# Patient Record
Sex: Male | Born: 1970 | Race: White | Hispanic: No | Marital: Married | Smoking: Never smoker
Health system: Southern US, Community
[De-identification: ages and names within clinical notes are randomized; demographics above are authoritative.]

## PROBLEM LIST (undated history)

## (undated) DIAGNOSIS — F329 Major depressive disorder, single episode, unspecified: Secondary | ICD-10-CM

## (undated) DIAGNOSIS — E1142 Type 2 diabetes mellitus with diabetic polyneuropathy: Secondary | ICD-10-CM

## (undated) DIAGNOSIS — IMO0001 Reserved for inherently not codable concepts without codable children: Secondary | ICD-10-CM

## (undated) DIAGNOSIS — E119 Type 2 diabetes mellitus without complications: Secondary | ICD-10-CM

## (undated) DIAGNOSIS — M542 Cervicalgia: Secondary | ICD-10-CM

## (undated) DIAGNOSIS — R1319 Other dysphagia: Secondary | ICD-10-CM

## (undated) DIAGNOSIS — E669 Obesity, unspecified: Secondary | ICD-10-CM

## (undated) DIAGNOSIS — K200 Eosinophilic esophagitis: Secondary | ICD-10-CM

## (undated) DIAGNOSIS — M7502 Adhesive capsulitis of left shoulder: Secondary | ICD-10-CM

## (undated) DIAGNOSIS — Z86010 Personal history of colonic polyps: Secondary | ICD-10-CM

## (undated) DIAGNOSIS — F32A Depression, unspecified: Secondary | ICD-10-CM

## (undated) DIAGNOSIS — E113599 Type 2 diabetes mellitus with proliferative diabetic retinopathy without macular edema, unspecified eye: Secondary | ICD-10-CM

## (undated) DIAGNOSIS — R4589 Other symptoms and signs involving emotional state: Secondary | ICD-10-CM

## (undated) DIAGNOSIS — Z800 Family history of malignant neoplasm of digestive organs: Secondary | ICD-10-CM

## (undated) DIAGNOSIS — Z794 Long term (current) use of insulin: Secondary | ICD-10-CM

## (undated) DIAGNOSIS — J309 Allergic rhinitis, unspecified: Secondary | ICD-10-CM

## (undated) DIAGNOSIS — E7800 Pure hypercholesterolemia, unspecified: Secondary | ICD-10-CM

## (undated) DIAGNOSIS — Z860101 Personal history of adenomatous and serrated colon polyps: Secondary | ICD-10-CM

## (undated) DIAGNOSIS — K219 Gastro-esophageal reflux disease without esophagitis: Secondary | ICD-10-CM

## (undated) DIAGNOSIS — F419 Anxiety disorder, unspecified: Secondary | ICD-10-CM

## (undated) DIAGNOSIS — I10 Essential (primary) hypertension: Secondary | ICD-10-CM

## (undated) DIAGNOSIS — J329 Chronic sinusitis, unspecified: Secondary | ICD-10-CM

## (undated) HISTORY — DX: Personal history of colonic polyps: Z86.010

## (undated) HISTORY — DX: Pure hypercholesterolemia, unspecified: E78.00

## (undated) HISTORY — DX: Eosinophilic esophagitis: K20.0

## (undated) HISTORY — DX: Personal history of adenomatous and serrated colon polyps: Z86.0101

## (undated) HISTORY — DX: Type 2 diabetes mellitus with proliferative diabetic retinopathy without macular edema, unspecified eye: E11.3599

## (undated) HISTORY — DX: Family history of malignant neoplasm of digestive organs: Z80.0

## (undated) HISTORY — DX: Allergic rhinitis, unspecified: J30.9

## (undated) HISTORY — DX: Other symptoms and signs involving emotional state: R45.89

## (undated) HISTORY — DX: Essential (primary) hypertension: I10

## (undated) HISTORY — DX: Adhesive capsulitis of left shoulder: M75.02

## (undated) HISTORY — DX: Other dysphagia: R13.19

## (undated) HISTORY — DX: Chronic sinusitis, unspecified: J32.9

## (undated) HISTORY — DX: Type 2 diabetes mellitus with diabetic polyneuropathy: E11.42

## (undated) HISTORY — DX: Obesity, unspecified: E66.9

## (undated) HISTORY — DX: Long term (current) use of insulin: Z79.4

## (undated) HISTORY — DX: Cervicalgia: M54.2

## (undated) HISTORY — PX: NO PAST SURGERIES: SHX2092

---

## 2015-01-21 DIAGNOSIS — N529 Male erectile dysfunction, unspecified: Secondary | ICD-10-CM

## 2015-01-21 HISTORY — DX: Male erectile dysfunction, unspecified: N52.9

## 2015-04-06 ENCOUNTER — Ambulatory Visit
Admission: RE | Admit: 2015-04-06 | Discharge: 2015-04-06 | Disposition: A | Discharge status: Home / Self Care | Payer: Federal, State, Local not specified - PPO | Source: Ambulatory Visit | Attending: Internal Medicine | Admitting: Internal Medicine

## 2015-04-06 ENCOUNTER — Other Ambulatory Visit: Payer: Self-pay | Admitting: Internal Medicine

## 2015-04-06 DIAGNOSIS — M25512 Pain in left shoulder: Secondary | ICD-10-CM

## 2015-06-05 ENCOUNTER — Ambulatory Visit: Payer: Federal, State, Local not specified - PPO | Admitting: *Deleted

## 2015-11-04 ENCOUNTER — Other Ambulatory Visit: Payer: Self-pay | Admitting: Gastroenterology

## 2015-11-23 ENCOUNTER — Encounter (HOSPITAL_COMMUNITY): Payer: Self-pay | Admitting: *Deleted

## 2015-12-07 ENCOUNTER — Ambulatory Visit (HOSPITAL_COMMUNITY): Payer: Federal, State, Local not specified - PPO | Admitting: Certified Registered"

## 2015-12-07 ENCOUNTER — Ambulatory Visit (HOSPITAL_COMMUNITY)
Admission: RE | Admit: 2015-12-07 | Discharge: 2015-12-07 | Disposition: A | Discharge status: Home / Self Care | Payer: Federal, State, Local not specified - PPO | Source: Ambulatory Visit | Attending: Gastroenterology | Admitting: Gastroenterology

## 2015-12-07 ENCOUNTER — Encounter (HOSPITAL_COMMUNITY): Payer: Self-pay

## 2015-12-07 ENCOUNTER — Encounter (HOSPITAL_COMMUNITY)
Admission: RE | Disposition: A | Discharge status: Home / Self Care | Payer: Self-pay | Source: Ambulatory Visit | Attending: Gastroenterology

## 2015-12-07 DIAGNOSIS — E1140 Type 2 diabetes mellitus with diabetic neuropathy, unspecified: Secondary | ICD-10-CM | POA: Diagnosis not present

## 2015-12-07 DIAGNOSIS — Z8719 Personal history of other diseases of the digestive system: Secondary | ICD-10-CM | POA: Diagnosis not present

## 2015-12-07 DIAGNOSIS — D123 Benign neoplasm of transverse colon: Secondary | ICD-10-CM | POA: Diagnosis not present

## 2015-12-07 DIAGNOSIS — Z800 Family history of malignant neoplasm of digestive organs: Secondary | ICD-10-CM | POA: Diagnosis not present

## 2015-12-07 DIAGNOSIS — E119 Type 2 diabetes mellitus without complications: Secondary | ICD-10-CM | POA: Diagnosis not present

## 2015-12-07 DIAGNOSIS — Z1211 Encounter for screening for malignant neoplasm of colon: Secondary | ICD-10-CM | POA: Insufficient documentation

## 2015-12-07 DIAGNOSIS — R1314 Dysphagia, pharyngoesophageal phase: Secondary | ICD-10-CM | POA: Diagnosis not present

## 2015-12-07 DIAGNOSIS — Z794 Long term (current) use of insulin: Secondary | ICD-10-CM | POA: Insufficient documentation

## 2015-12-07 DIAGNOSIS — R1310 Dysphagia, unspecified: Secondary | ICD-10-CM | POA: Insufficient documentation

## 2015-12-07 DIAGNOSIS — K200 Eosinophilic esophagitis: Secondary | ICD-10-CM | POA: Diagnosis not present

## 2015-12-07 HISTORY — DX: Reserved for inherently not codable concepts without codable children: IMO0001

## 2015-12-07 HISTORY — DX: Depression, unspecified: F32.A

## 2015-12-07 HISTORY — DX: Type 2 diabetes mellitus without complications: E11.9

## 2015-12-07 HISTORY — PX: ESOPHAGOGASTRODUODENOSCOPY (EGD) WITH PROPOFOL: SHX5813

## 2015-12-07 HISTORY — DX: Anxiety disorder, unspecified: F41.9

## 2015-12-07 HISTORY — PX: COLONOSCOPY WITH PROPOFOL: SHX5780

## 2015-12-07 HISTORY — DX: Major depressive disorder, single episode, unspecified: F32.9

## 2015-12-07 HISTORY — DX: Gastro-esophageal reflux disease without esophagitis: K21.9

## 2015-12-07 LAB — GLUCOSE, CAPILLARY: Glucose-Capillary: 252 mg/dL — ABNORMAL HIGH (ref 65–99)

## 2015-12-07 SURGERY — COLONOSCOPY WITH PROPOFOL
Anesthesia: Monitor Anesthesia Care

## 2015-12-07 MED ORDER — PROPOFOL 10 MG/ML IV BOLUS
INTRAVENOUS | Status: AC
  Filled 2015-12-07: qty 60

## 2015-12-07 MED ORDER — SODIUM CHLORIDE 0.9 % IV SOLN: INTRAVENOUS | Status: DC

## 2015-12-07 MED ORDER — LIDOCAINE HCL (CARDIAC) 20 MG/ML IV SOLN
INTRAVENOUS | Status: DC | PRN
  Administered 2015-12-07: 40 mg via INTRAVENOUS

## 2015-12-07 MED ORDER — MIDAZOLAM HCL 2 MG/2ML IJ SOLN
INTRAMUSCULAR | Status: AC
  Filled 2015-12-07: qty 2

## 2015-12-07 MED ORDER — PROPOFOL 500 MG/50ML IV EMUL
INTRAVENOUS | Status: DC | PRN
  Administered 2015-12-07: 300 ug/kg/min via INTRAVENOUS

## 2015-12-07 MED ORDER — PROPOFOL 10 MG/ML IV BOLUS
INTRAVENOUS | Status: AC
  Filled 2015-12-07: qty 20

## 2015-12-07 MED ORDER — PROPOFOL 10 MG/ML IV BOLUS
INTRAVENOUS | Status: DC | PRN
  Administered 2015-12-07: 120 mg via INTRAVENOUS
  Administered 2015-12-07: 20 mg via INTRAVENOUS

## 2015-12-07 MED ORDER — LACTATED RINGERS IV SOLN
INTRAVENOUS | Status: DC
  Administered 2015-12-07: 07:00:00 via INTRAVENOUS

## 2015-12-07 MED ORDER — LIDOCAINE HCL (CARDIAC) 20 MG/ML IV SOLN
INTRAVENOUS | Status: AC
  Filled 2015-12-07: qty 5

## 2015-12-07 MED ORDER — ONDANSETRON HCL 4 MG/2ML IJ SOLN
INTRAMUSCULAR | Status: AC
  Filled 2015-12-07: qty 2

## 2015-12-07 MED ORDER — ONDANSETRON HCL 4 MG/2ML IJ SOLN
INTRAMUSCULAR | Status: DC | PRN
  Administered 2015-12-07: 4 mg via INTRAVENOUS

## 2015-12-07 SURGICAL SUPPLY — 24 items

## 2015-12-07 NOTE — Discharge Instructions (Signed)
Colonoscopy, Care After °These instructions give you information on caring for yourself after your procedure. Your doctor may also give you more specific instructions. Call your doctor if you have any problems or questions after your procedure. °HOME CARE °· Do not drive for 24 hours. °· Do not sign important papers or use machinery for 24 hours. °· You may shower. °· You may go back to your usual activities, but go slower for the first 24 hours. °· Take rest breaks often during the first 24 hours. °· Walk around or use warm packs on your belly (abdomen) if you have belly cramping or gas. °· Drink enough fluids to keep your pee (urine) clear or pale yellow. °· Resume your normal diet. Avoid heavy or fried foods. °· Avoid drinking alcohol for 24 hours or as told by your doctor. °· Only take medicines as told by your doctor. °If a tissue sample (biopsy) was taken during the procedure:  °· Do not take aspirin or blood thinners for 7 days, or as told by your doctor. °· Do not drink alcohol for 7 days, or as told by your doctor. °· Eat soft foods for the first 24 hours. °GET HELP IF: °You still have a small amount of blood in your poop (stool) 2-3 days after the procedure. °GET HELP RIGHT AWAY IF: °· You have more than a small amount of blood in your poop. °· You see clumps of tissue (blood clots) in your poop. °· Your belly is puffy (swollen). °· You feel sick to your stomach (nauseous) or throw up (vomit). °· You have a fever. °· You have belly pain that gets worse and medicine does not help. °MAKE SURE YOU: °· Understand these instructions. °· Will watch your condition. °· Will get help right away if you are not doing well or get worse. °  °This information is not intended to replace advice given to you by your health care provider. Make sure you discuss any questions you have with your health care provider. °  °Document Released: 09/10/2010 Document Revised: 08/13/2013 Document Reviewed: 04/15/2013 °Elsevier  Interactive Patient Education ©2016 [employer] °Esophagogastroduodenoscopy, Care After °Refer to this sheet in the next few weeks. These instructions provide you with information about caring for yourself after your procedure. Your health care provider may also give you more specific instructions. Your treatment has been planned according to current medical practices, but problems sometimes occur. Call your health care provider if you have any problems or questions after your procedure. °WHAT TO EXPECT AFTER THE PROCEDURE °After your procedure, it is typical to feel: °· Soreness in your throat. °· Pain with swallowing. °· Sick to your stomach (nauseous). °· Bloated. °· Dizzy. °· Fatigued. °HOME CARE INSTRUCTIONS °· Do not eat or drink anything until the numbing medicine (local anesthetic) has worn off and your gag reflex has returned. You will know that the local anesthetic has worn off when you can swallow comfortably. °· Do not drive or operate machinery until directed by your health care provider. °· Take medicines only as directed by your health care provider. °SEEK MEDICAL CARE IF:  °· You cannot stop coughing. °· You are not urinating at all or less than usual. °SEEK IMMEDIATE MEDICAL CARE IF: °· You have difficulty swallowing. °· You cannot eat or drink. °· You have worsening throat or chest pain. °· You have dizziness or lightheadedness or you faint. °· You have nausea or vomiting. °· You have chills. °· You have a fever. °·   You have severe abdominal pain. °· You have black, tarry, or bloody stools. °  °This information is not intended to replace advice given to you by your health care provider. Make sure you discuss any questions you have with your health care provider. °  °Document Released: 07/25/2012 Document Revised: 08/29/2014 Document Reviewed: 07/25/2012 °Elsevier Interactive Patient Education ©2016 [employer] ° °

## 2015-12-07 NOTE — Anesthesia Postprocedure Evaluation (Signed)
Anesthesia Post Note  Patient: Joshua Mendez  Procedure(s) Performed: Procedure(s) (LRB): COLONOSCOPY WITH PROPOFOL (N/A) ESOPHAGOGASTRODUODENOSCOPY (EGD) WITH PROPOFOL (N/A)  Patient location during evaluation: PACU Anesthesia Type: MAC Level of consciousness: awake and alert Pain management: pain level controlled Vital Signs Assessment: post-procedure vital signs reviewed and stable Respiratory status: spontaneous breathing, nonlabored ventilation, respiratory function stable and patient connected to nasal cannula oxygen Cardiovascular status: blood pressure returned to baseline and stable Postop Assessment: no signs of nausea or vomiting Anesthetic complications: no    Last Vitals:  Filed Vitals:   12/07/15 0940 12/07/15 0950  BP: 130/85 123/82  Pulse: 81 76  Temp:    Resp: 16 15    Last Pain: There were no vitals filed for this visit.               Malory Spurr L

## 2015-12-07 NOTE — Anesthesia Preprocedure Evaluation (Addendum)
Anesthesia Evaluation  Patient identified by MRN, date of birth, ID band Patient awake    Reviewed: Allergy & Precautions, H&P , NPO status , Patient's Chart, lab work & pertinent test results  Airway Mallampati: II  TM Distance: >3 FB Neck ROM: full    Dental no notable dental hx. (+) Teeth Intact, Dental Advisory Given   Pulmonary neg pulmonary ROS, shortness of breath and with exertion,    Pulmonary exam normal breath sounds clear to auscultation       Cardiovascular Exercise Tolerance: Good negative cardio ROS Normal cardiovascular exam Rhythm:regular Rate:Normal     Neuro/Psych negative neurological ROS  negative psych ROS   GI/Hepatic negative GI ROS, Neg liver ROS,   Endo/Other  diabetes, Well Controlled, Type 2, Insulin Dependent  Renal/GU negative Renal ROS  negative genitourinary   Musculoskeletal   Abdominal   Peds  Hematology negative hematology ROS (+)   Anesthesia Other Findings   Reproductive/Obstetrics negative OB ROS                           Anesthesia Physical Anesthesia Plan  ASA: III  Anesthesia Plan: MAC   Post-op Pain Management:    Induction:   Airway Management Planned:   Additional Equipment:   Intra-op Plan:   Post-operative Plan:   Informed Consent: I have reviewed the patients History and Physical, chart, labs and discussed the procedure including the risks, benefits and alternatives for the proposed anesthesia with the patient or authorized representative who has indicated his/her understanding and acceptance.   Dental Advisory Given  Plan Discussed with: CRNA  Anesthesia Plan Comments:         Anesthesia Quick Evaluation

## 2015-12-07 NOTE — Op Note (Signed)
[facility] Patient Name: Joshua Mendez Procedure Date: 12/07/2015 MRN: [id number] Attending MD: Garlan Fair , MD Date of Birth: 1970/10/09 CSN:  Age: 45 Admit Type: Outpatient Procedure:                Colonoscopy Indications:              High risk colon cancer surveillance: 03/12/2002                            colonoscopy performed with removal of a 5 mm rectal                            adenomatous polyp. Providers:                Garlan Fair, MD, Laverta Baltimore, RN, Despina Pole, Technician, Ralene Bathe, Technician Referring MD:              Medicines:                Propofol per Anesthesia Complications:            No immediate complications. Estimated Blood Loss:     Estimated blood loss: none. Procedure:                Pre-Anesthesia Assessment:                           - Prior to the procedure, a History and Physical                            was performed, and patient medications and                            allergies were reviewed. The patient's tolerance of                            previous anesthesia was also reviewed. The risks                            and benefits of the procedure and the sedation                            options and risks were discussed with the patient.                            All questions were answered, and informed consent                            was obtained. Prior Anticoagulants: The patient has                            taken no previous anticoagulant or antiplatelet  agents. ASA Grade Assessment: II - A patient with                            mild systemic disease. After reviewing the risks                            and benefits, the patient was deemed in                            satisfactory condition to undergo the procedure.                           After obtaining informed consent, the colonoscope                            was passed  under direct vision. Throughout the                            procedure, the patient's blood pressure, pulse, and                            oxygen saturations were monitored continuously. The                            was introduced through the anus and advanced to the                            the cecum, identified by appendiceal orifice and                            ileocecal valve. The colonoscopy was performed                            without difficulty. The patient tolerated the                            procedure well. The quality of the bowel                            preparation was good. The ileocecal valve, the                            appendiceal orifice and the rectum were                            photographed. Scope In: 8:47:23 AM Scope Out: 9:04:56 AM Scope Withdrawal Time: 0 hours 12 minutes 0 seconds  Total Procedure Duration: 0 hours 17 minutes 33 seconds  Findings:      The perianal and digital rectal examinations were normal.      A 5 mm polyp was found in the mid transverse colon. The polyp was       sessile. The polyp was removed with a cold snare. Resection and       retrieval were complete.  The exam was otherwise without abnormality. Impression:               - One 5 mm polyp in the mid transverse colon,                            removed with a cold snare. Resected and retrieved.                           - The examination was otherwise normal. Moderate Sedation:      N/A- Per Anesthesia Care Recommendation:           - Patient has a contact number available for                            emergencies. The signs and symptoms of potential                            delayed complications were discussed with the                            patient. Return to normal activities tomorrow.                            Written discharge instructions were provided to the                            patient.                           - Repeat colonoscopy in  5 years for surveillance.                           - Resume previous diet.                           - Continue present medications. Procedure Code(s):        --- Professional ---                           [phone], Colonoscopy, flexible; with removal of                            tumor(s), polyp(s), or other lesion(s) by snare                            technique Diagnosis Code(s):        --- Professional ---                           Z86.010, Personal history of colonic polyps                           D12.3, Benign neoplasm of transverse colon (hepatic                            flexure or splenic flexure) CPT  copyright 2016 American Medical Association. All rights reserved. The codes documented in this report are preliminary and upon coder review may  be revised to meet current compliance requirements. Earle Gell, MD Garlan Fair, MD 12/07/2015 9:12:06 AM This report has been signed electronically. Number of Addenda: 0

## 2015-12-07 NOTE — Op Note (Signed)
[facility] Patient Name: Joshua Mendez Procedure Date: 12/07/2015 MRN: [id number] Attending MD: Garlan Fair , MD Date of Birth: 11-12-1970 CSN:  Age: 45 Admit Type: Outpatient Procedure:                Upper GI endoscopy Indications:              Dysphagia. Eosinophilic esophagitis diagnosed by                            EGD with biopsies in 2012 Providers:                Garlan Fair, MD, Laverta Baltimore, RN, Despina Pole, Technician, Ralene Bathe, Technician, Dione Booze, CRNA Referring MD:              Medicines:                Propofol per Anesthesia Complications:            No immediate complications. Estimated Blood Loss:     Estimated blood loss: none. Procedure:                Pre-Anesthesia Assessment:                           - Prior to the procedure, a History and Physical                            was performed, and patient medications and                            allergies were reviewed. The patient's tolerance of                            previous anesthesia was also reviewed. The risks                            and benefits of the procedure and the sedation                            options and risks were discussed with the patient.                            All questions were answered, and informed consent                            was obtained. Prior Anticoagulants: The patient has                            taken no previous anticoagulant or antiplatelet                            agents.  ASA Grade Assessment: II - A patient with                            mild systemic disease. After reviewing the risks                            and benefits, the patient was deemed in                            satisfactory condition to undergo the procedure.                           - Prior to the procedure, a History and Physical                            was performed, and patient  medications and                            allergies were reviewed. The patient's tolerance of                            previous anesthesia was also reviewed. The risks                            and benefits of the procedure and the sedation                            options and risks were discussed with the patient.                            All questions were answered, and informed consent                            was obtained. Prior Anticoagulants: The patient has                            taken no previous anticoagulant or antiplatelet                            agents. ASA Grade Assessment: II - A patient with                            mild systemic disease. After reviewing the risks                            and benefits, the patient was deemed in                            satisfactory condition to undergo the procedure.                           After obtaining informed consent, the endoscope was  passed under direct vision. Throughout the                            procedure, the patient's blood pressure, pulse, and                            oxygen saturations were monitored continuously. The                            [id number] [phone]) scope was introduced through the                            mouth, and advanced to the second part of duodenum.                            The upper GI endoscopy was accomplished without                            difficulty. The patient tolerated the procedure                            well. Scope In: Scope Out: Findings:      The Z-line was regular and was found 44 cm from the incisors.      The examined esophagus was normal. Biopsies were taken with a cold       forceps for histology.      The entire examined stomach was normal.      The examined duodenum was normal. Impression:               - Z-line regular, 44 cm from the incisors.                           - Normal esophagus. Biopsied.                            - Normal stomach.                           - Normal examined duodenum. Moderate Sedation:      N/A- Per Anesthesia Care Recommendation:           - Patient has a contact number available for                            emergencies. The signs and symptoms of potential                            delayed complications were discussed with the                            patient. Return to normal activities tomorrow.                            Written discharge instructions were provided to the  patient.                           - Await pathology results.                           - Resume previous diet.                           - Continue present medications. Procedure Code(s):        --- Professional ---                           [phone], Esophagogastroduodenoscopy, flexible,                            transoral; with biopsy, single or multiple Diagnosis Code(s):        --- Professional ---                           R13.10, Dysphagia, unspecified CPT copyright 2016 American Medical Association. All rights reserved. The codes documented in this report are preliminary and upon coder review may  be revised to meet current compliance requirements. Earle Gell, MD Garlan Fair, MD 12/07/2015 9:16:08 AM This report has been signed electronically. Number of Addenda: 0

## 2015-12-07 NOTE — H&P (Signed)
  Procedure: Surveillance colonoscopy and diagnostic esophagogastroduodenoscopy with esophageal stricture dilation. History of eosinophilic esophagitis. 03/12/2002 colonoscopy was performed with removal of a 5 mm rectal adenomatous colon polyp. Normal surveillance colonoscopies were performed in 2004, 2008, 2012. Father diagnosed with colon cancer.  History: The patient is a 45 year old male born [birth year]. He is experiencing intermittent esophageal dysphagia. On 12/03/2010 he underwent an esophagogastroduodenoscopy with dilation of a Schatzki's ring. Random esophageal biopsies were consistent with eosinophilic esophagitis with 60-100 eosinophils per high-powered field. He has been lax taking his proton pump inhibitor therapy.  He is scheduled to undergo diagnostic esophagogastroduodenoscopy with possible esophageal stricture dilation and screen for eosinophilic esophagitis followed by surveillance colonoscopy.  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 Hypertension. Gastroesophageal reflux.  Medication allergies: Penicillin caused rash. Neomycin caused rash.  Exam: The patient is alert and lying comfortably on the endoscopy stretcher. Abdomen is soft and nontender to palpation. Lungs are clear to auscultation. Cardiac exam reveals a regular rhythm.  Plan: Proceed with diagnostic esophagogastroduodenoscopy, possible esophageal stricture dilation, screen for eosinophilic esophagitis, and surveillance colonoscopy

## 2015-12-07 NOTE — Transfer of Care (Signed)
Immediate Anesthesia Transfer of Care Note  Patient: Joshua Mendez  Procedure(s) Performed: Procedure(s): COLONOSCOPY WITH PROPOFOL (N/A) ESOPHAGOGASTRODUODENOSCOPY (EGD) WITH PROPOFOL (N/A)  Patient Location: PACU  Anesthesia Type:MAC  Level of Consciousness:  sedated, patient cooperative and responds to stimulation  Airway & Oxygen Therapy:Patient Spontanous Breathing and Patient connected to nasal oxgen  Post-op Assessment:  Report given to PACU RN and Post -op Vital signs reviewed and stable  Post vital signs:  Reviewed and stable  Last Vitals:  Filed Vitals:   12/07/15 0639  Pulse: 82  Temp: 36.7 C  Resp: 13    Complications: No apparent anesthesia complications

## 2015-12-08 ENCOUNTER — Encounter (HOSPITAL_COMMUNITY): Payer: Self-pay | Admitting: Gastroenterology

## 2016-03-07 DIAGNOSIS — I10 Essential (primary) hypertension: Secondary | ICD-10-CM | POA: Diagnosis not present

## 2016-03-07 DIAGNOSIS — F322 Major depressive disorder, single episode, severe without psychotic features: Secondary | ICD-10-CM | POA: Diagnosis not present

## 2016-03-07 DIAGNOSIS — E1165 Type 2 diabetes mellitus with hyperglycemia: Secondary | ICD-10-CM | POA: Diagnosis not present

## 2016-03-07 DIAGNOSIS — E7800 Pure hypercholesterolemia, unspecified: Secondary | ICD-10-CM | POA: Diagnosis not present

## 2016-03-07 DIAGNOSIS — E1142 Type 2 diabetes mellitus with diabetic polyneuropathy: Secondary | ICD-10-CM | POA: Diagnosis not present

## 2016-06-07 DIAGNOSIS — I10 Essential (primary) hypertension: Secondary | ICD-10-CM | POA: Diagnosis not present

## 2016-06-07 DIAGNOSIS — F322 Major depressive disorder, single episode, severe without psychotic features: Secondary | ICD-10-CM | POA: Diagnosis not present

## 2016-06-07 DIAGNOSIS — E1165 Type 2 diabetes mellitus with hyperglycemia: Secondary | ICD-10-CM | POA: Diagnosis not present

## 2016-06-07 DIAGNOSIS — Z23 Encounter for immunization: Secondary | ICD-10-CM | POA: Diagnosis not present

## 2016-06-07 DIAGNOSIS — E1142 Type 2 diabetes mellitus with diabetic polyneuropathy: Secondary | ICD-10-CM | POA: Diagnosis not present

## 2016-08-24 DIAGNOSIS — E113293 Type 2 diabetes mellitus with mild nonproliferative diabetic retinopathy without macular edema, bilateral: Secondary | ICD-10-CM | POA: Diagnosis not present

## 2016-08-24 DIAGNOSIS — H524 Presbyopia: Secondary | ICD-10-CM | POA: Diagnosis not present

## 2016-09-23 DIAGNOSIS — H01115 Allergic dermatitis of left lower eyelid: Secondary | ICD-10-CM | POA: Diagnosis not present

## 2016-09-23 DIAGNOSIS — H01112 Allergic dermatitis of right lower eyelid: Secondary | ICD-10-CM | POA: Diagnosis not present

## 2016-09-23 DIAGNOSIS — H01111 Allergic dermatitis of right upper eyelid: Secondary | ICD-10-CM | POA: Diagnosis not present

## 2016-10-03 DIAGNOSIS — J309 Allergic rhinitis, unspecified: Secondary | ICD-10-CM | POA: Diagnosis not present

## 2016-10-03 DIAGNOSIS — H1013 Acute atopic conjunctivitis, bilateral: Secondary | ICD-10-CM | POA: Diagnosis not present

## 2016-11-30 DIAGNOSIS — J309 Allergic rhinitis, unspecified: Secondary | ICD-10-CM | POA: Diagnosis not present

## 2016-11-30 DIAGNOSIS — M79672 Pain in left foot: Secondary | ICD-10-CM | POA: Diagnosis not present

## 2016-11-30 DIAGNOSIS — R0789 Other chest pain: Secondary | ICD-10-CM | POA: Diagnosis not present

## 2016-12-01 ENCOUNTER — Ambulatory Visit
Admission: RE | Admit: 2016-12-01 | Discharge: 2016-12-01 | Disposition: A | Discharge status: Home / Self Care | Payer: Federal, State, Local not specified - PPO | Source: Ambulatory Visit | Attending: Internal Medicine | Admitting: Internal Medicine

## 2016-12-01 ENCOUNTER — Other Ambulatory Visit: Payer: Self-pay | Admitting: Internal Medicine

## 2016-12-01 DIAGNOSIS — M79672 Pain in left foot: Secondary | ICD-10-CM | POA: Diagnosis not present

## 2016-12-01 DIAGNOSIS — E1165 Type 2 diabetes mellitus with hyperglycemia: Secondary | ICD-10-CM | POA: Diagnosis not present

## 2016-12-01 DIAGNOSIS — Z5181 Encounter for therapeutic drug level monitoring: Secondary | ICD-10-CM | POA: Diagnosis not present

## 2016-12-01 DIAGNOSIS — Z794 Long term (current) use of insulin: Secondary | ICD-10-CM | POA: Diagnosis not present

## 2016-12-01 DIAGNOSIS — Z79899 Other long term (current) drug therapy: Secondary | ICD-10-CM | POA: Diagnosis not present

## 2016-12-01 DIAGNOSIS — E669 Obesity, unspecified: Secondary | ICD-10-CM | POA: Diagnosis not present

## 2016-12-02 NOTE — Progress Notes (Signed)
Cardiology Office Note   Date:  12/06/2016   ID:  Joshua Mendez, DOB [birth year], MRN [id number]  PCP:  Wenda Low, MD  Cardiologist:   Jenkins Rouge, MD   Chief Complaint  Patient presents with  . Establish Care  . Chest Pain      History of Present Illness: Joshua Mendez is a 46 y.o. male who presents for evaluation/consultation of chest pain.  Referred by Dr Lysle Rubens.   CRF;s DM, elevated lipids and HTN.  History of Schatzkis ring and esophagitis with dilatation Reviewed office Note Dr Wenda Low from [facility] 11/30/16 .   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 Was walking Lasted about an hour. No associated dyspnea, palpitations Diaphoresis or syncope No GI overtones Resolved spontaneously. Pain in center chest with no radiation Last stress Test was 7 years ago   Also complains of dependant edema   Past Medical History:  Diagnosis Date  . Anxiety   . Depression   . Diabetes mellitus without complication ([place])   . GERD (gastroesophageal reflux disease)   . Shortness of breath dyspnea    with exercise    Past Surgical History:  Procedure Laterality Date  . COLONOSCOPY WITH PROPOFOL N/A 12/07/2015   Procedure: COLONOSCOPY WITH PROPOFOL;  Surgeon: Garlan Fair, MD;  Location: [facility];  Service: Endoscopy;  Laterality: N/A;  . ESOPHAGOGASTRODUODENOSCOPY (EGD) WITH PROPOFOL N/A 12/07/2015   Procedure: ESOPHAGOGASTRODUODENOSCOPY (EGD) WITH PROPOFOL;  Surgeon: Garlan Fair, MD;  Location: [facility];  Service: Endoscopy;  Laterality: N/A;  . NO PAST SURGERIES       Current Outpatient Prescriptions  Medication Sig Dispense Refill  . acetaminophen (TYLENOL) 500 MG tablet Take 500 mg by mouth every 6 (six) hours as needed for mild pain or  moderate pain.    Marland Kitchen aspirin EC 81 MG tablet Take 81 mg by mouth daily.    Marland Kitchen atorvastatin (LIPITOR) 40 MG tablet Take 40 mg by mouth daily.    . cetirizine (ZYRTEC) 10 MG tablet Take 10 mg by mouth daily.    . Dapagliflozin-Metformin HCl ER (XIGDUO XR) 12-998 MG TB24 Take 2 tablets by mouth every morning.     . escitalopram (LEXAPRO) 10 MG tablet Take 10 mg by mouth at bedtime.     . fluticasone (FLONASE) 50 MCG/ACT nasal spray Place 2 sprays into both nostrils daily.    Marland Kitchen gabapentin (NEURONTIN) 100 MG capsule Take 100 mg by mouth at bedtime.     Marland Kitchen ibuprofen (ADVIL,MOTRIN) 200 MG tablet Take 400 mg by mouth every 6 (six) hours as needed for moderate pain.    Marland Kitchen insulin aspart (NOVOLOG) 100 UNIT/ML injection Inject 40 Units into the skin continuous.    . Insulin Disposable Pump (V-GO 30) KIT by Does not apply route.    . Insulin Disposable Pump (V-GO 40) KIT Inject 40 Units into the skin daily.    Marland Kitchen linagliptin (TRADJENTA) 5 MG TABS tablet Take 5 mg by mouth daily.    Marland Kitchen lisinopril (PRINIVIL,ZESTRIL) 5 MG tablet Take 5 mg by mouth daily.    . meloxicam (MOBIC) 15 MG tablet Take 15 mg by mouth daily as needed for pain.    . naproxen sodium (ANAPROX) 220 MG tablet  Take 220 mg by mouth daily as needed (pain).    Marland Kitchen olopatadine (PATANOL) 0.1 % ophthalmic solution Place 1 drop into the left eye 2 (two) times daily as needed.    Marland Kitchen omeprazole (PRILOSEC) 20 MG capsule Take 20 mg by mouth daily.     No current facility-administered medications for this visit.     Allergies:   Penicillins and Neosporin [neomycin-bacitracin zn-polymyx]    Social History:  The patient  reports that he has never smoked. He does not have any smokeless tobacco history on file. He reports that he drinks alcohol. He reports that he does not use drugs.   Family History:  The patient's family history is not on file.    ROS:  Please see the history of present illness.   Otherwise, review of systems are positive for none.   All  other systems are reviewed and negative.    PHYSICAL EXAM: VS:  BP 104/68   Pulse 87   Ht 6' 1"  (1.854 m)   Wt 255 lb 12.8 oz (116 kg)   SpO2 (!) 87%   BMI 33.75 kg/m  , BMI Body mass index is 33.75 kg/m. Affect appropriate Healthy:  appears stated age 74: normal Neck supple with no adenopathy JVP normal no bruits no thyromegaly Lungs clear with no wheezing and good diaphragmatic motion Heart:  S1/S2 no murmur, no rub, gallop or click PMI normal Abdomen: benighn, BS positve, no tenderness, no AAA no bruit.  No HSM or HJR Distal pulses intact with no bruits No edema Neuro non-focal Skin warm and dry No muscular weakness    EKG:  12/06/16  SR rate 82 Q 3,F poor R wave progression    Recent Labs: No results found for requested labs within last 8760 hours.    Lipid Panel No results found for: CHOL, TRIG, HDL, CHOLHDL, VLDL, LDLCALC, LDLDIRECT    Wt Readings from Last 3 Encounters:  12/06/16 255 lb 12.8 oz (116 kg)  12/07/15 250 lb (113.4 kg)      Other studies Reviewed: Additional studies/ records that were reviewed today include: Notes [facility] outpatient Dr Lorenda Hatchet.    ASSESSMENT AND PLAN:  1.  Chest Pain Atypical but abnormal ECG suggesting possible silent MI in diabetic f/u exercise myovue And echo Discussed doing stress testing every 3 years 2. DM Discussed low carb diet.  Target hemoglobin A1c is 6.5 or less.  Continue current medications. 3. HTN Well controlled.  Continue current medications and low sodium Dash type diet.   4. Cholesterol on statin labs with primary  5. GERD low carb diet prilosec  6. Abnormal ECG:  Doubt old MI echo for RWMA;s     Current medicines are reviewed at length with the patient today.  The patient does not have concerns regarding medicines.  The following changes have been made:  no change  Labs/ tests ordered today include: Echo Ex Myovue   Orders Placed This Encounter  Procedures  . Myocardial Perfusion Imaging  .  ECHOCARDIOGRAM COMPLETE     Disposition:   FU with me in a year      Signed, Jenkins Rouge, MD  12/06/2016 9:56 AM    [facility] [place], [place], [place]  [address] Phone: [phone]; Fax: [phone]

## 2016-12-06 ENCOUNTER — Ambulatory Visit (INDEPENDENT_AMBULATORY_CARE_PROVIDER_SITE_OTHER): Payer: Federal, State, Local not specified - PPO | Admitting: Cardiovascular Disease

## 2016-12-06 VITALS — BP 104/68 | HR 87 | Ht 73.0 in | Wt 255.8 lb

## 2016-12-06 DIAGNOSIS — Z7689 Persons encountering health services in other specified circumstances: Secondary | ICD-10-CM

## 2016-12-06 DIAGNOSIS — R0789 Other chest pain: Secondary | ICD-10-CM | POA: Diagnosis not present

## 2016-12-06 NOTE — Patient Instructions (Addendum)
Medication Instructions:  Your physician recommends that you continue on your current medications as directed. Please refer to the Current Medication list given to you today.  Labwork: NONE  Testing/Procedures: Your physician has requested that you have an echocardiogram. Echocardiography is a painless test that uses sound waves to create images of your heart. It provides your doctor with information about the size and shape of your heart and how well your heart's chambers and valves are working. This procedure takes approximately one hour. There are no restrictions for this procedure.  Your physician has requested that you have en exercise stress myoview. For further information please visit [email or web address]. Please follow instruction sheet, as given.  Follow-Up: Your physician wants you to follow-up in: 12 months with Dr. Nishan. You will receive a reminder letter in the mail two months in advance. If you don't receive a letter, please call our office to schedule the follow-up appointment.   If you need a refill on your cardiac medications before your next appointment, please call your pharmacy.    

## 2016-12-08 NOTE — Addendum Note (Signed)
Addended by: Roberts Gaudy on: 12/08/2016 09:13 AM   Modules accepted: Orders

## 2016-12-14 ENCOUNTER — Ambulatory Visit (INDEPENDENT_AMBULATORY_CARE_PROVIDER_SITE_OTHER): Payer: Federal, State, Local not specified - PPO | Admitting: Podiatry

## 2016-12-14 ENCOUNTER — Ambulatory Visit (INDEPENDENT_AMBULATORY_CARE_PROVIDER_SITE_OTHER): Payer: Federal, State, Local not specified - PPO

## 2016-12-14 VITALS — BP 117/71 | HR 65

## 2016-12-14 DIAGNOSIS — M722 Plantar fascial fibromatosis: Secondary | ICD-10-CM

## 2016-12-14 MED ORDER — DICLOFENAC SODIUM 75 MG PO TBEC: 75.0000 mg | DELAYED_RELEASE_TABLET | Freq: Two times a day (BID) | ORAL | 2 refills | Status: DC

## 2016-12-14 MED ORDER — TRIAMCINOLONE ACETONIDE 10 MG/ML IJ SUSP
10.0000 mg | Freq: Once | INTRAMUSCULAR | Status: AC
  Administered 2016-12-14: 10 mg

## 2016-12-14 NOTE — Patient Instructions (Signed)

## 2016-12-14 NOTE — Progress Notes (Signed)
   Subjective:    Patient ID: Joshua Mendez, male    DOB: 06-23-1971, 46 y.o.   MRN: [id number]  HPI    Review of Systems  HENT: Positive for sinus pressure.   Respiratory: Positive for shortness of breath.   Cardiovascular: Positive for chest pain and leg swelling.  Musculoskeletal: Positive for back pain and myalgias.  Neurological: Positive for dizziness and light-headedness.  All other systems reviewed and are negative.      Objective:   Physical Exam        Assessment & Plan:

## 2016-12-14 NOTE — Progress Notes (Signed)
Subjective:    Patient ID: Joshua Mendez, male   DOB: 46 y.o.   MRN: [id number]   HPI patient states she's developed a lot of pain in his left heel over the last several months and went to [employer] did a lot of walking and it's been bad since then    Review of Systems  All other systems reviewed and are negative.       Objective:  Physical Exam  Constitutional: He is oriented to person, place, and time.  Cardiovascular: Intact distal pulses.   Musculoskeletal: Normal range of motion.  Neurological: He is alert and oriented to person, place, and time.  Skin: Skin is warm.  Nursing note and vitals reviewed.  neurovascular status found to be intact muscle strength was adequate range of motion within normal limits with patient found to have exquisite discomfort plantar aspect left heel insertional point tendon the calcaneus and mild distal pain. Patient's found have good digital perfusion well oriented 3 and had normal sharp dull and vibratory     Assessment:     Acute plantar fasciitis left brought on by excessive walking with patient who is a diabetic and is overall very good as far as is taking care of his sugar    Plan:    H&P and condition reviewed with patient. Today I went ahead and injected the left fascia 3 mg Kenalog 5 mill grams Xylocaine and applied fascial brace with instructions on usage. Discussed long-term orthotics and gave instructions on stretching exercises and started on diclofenac 75 mg twice a day  X-rays indicate there is small spur formation with moderate depression of the arch and no indication of stress fracture

## 2016-12-19 ENCOUNTER — Telehealth (HOSPITAL_COMMUNITY): Payer: Self-pay | Admitting: *Deleted

## 2016-12-19 NOTE — Telephone Encounter (Signed)
Left message on voicemail in reference to upcoming appointment scheduled for 12/21/16. Phone number given for a call back so details instructions can be given.  [name]

## 2016-12-21 ENCOUNTER — Other Ambulatory Visit: Payer: Self-pay

## 2016-12-21 ENCOUNTER — Ambulatory Visit (HOSPITAL_COMMUNITY): Payer: Federal, State, Local not specified - PPO | Attending: Cardiology

## 2016-12-21 ENCOUNTER — Ambulatory Visit (HOSPITAL_BASED_OUTPATIENT_CLINIC_OR_DEPARTMENT_OTHER): Payer: Federal, State, Local not specified - PPO

## 2016-12-21 DIAGNOSIS — R0789 Other chest pain: Secondary | ICD-10-CM | POA: Diagnosis not present

## 2016-12-21 LAB — ECHOCARDIOGRAM COMPLETE
Ao-asc: 35 cm
Area-P 1/2: 3.44 cm2
CHL CUP DOP CALC LVOT VTI: 26 cm
CHL CUP MV DEC (S): 218
E/e' ratio: 5.89
EWDT: 218 ms
FS: 39 % (ref 28–44)
IVS/LV PW RATIO, ED: 1.11
LA ID, A-P, ES: 45 mm
LA diam index: 1.88 cm/m2
LA vol A4C: 56.6 ml
LA vol: 72.5 mL
LAVOLIN: 30.3 mL/m2
LEFT ATRIUM END SYS DIAM: 45 mm
LV E/e'average: 5.89
LV TDI E'LATERAL: 15.1
LV TDI E'MEDIAL: 11
LVEEMED: 5.89
LVELAT: 15.1 cm/s
LVOT area: 4.15 cm2
LVOT peak grad rest: 6 mmHg
LVOTD: 23 mm
LVOTPV: 126 cm/s
LVOTSV: 108 mL
Lateral S' vel: 17.1 cm/s
MV Peak grad: 3 mmHg
MV pk A vel: 58.7 m/s
MV pk E vel: 89 m/s
P 1/2 time: 64 ms
PW: 9 mm — AB (ref 0.6–1.1)
TAPSE: 22.9 mm

## 2016-12-21 LAB — MYOCARDIAL PERFUSION IMAGING
CHL CUP MPHR: 172 {beats}/min
CHL CUP RESTING HR STRESS: 75 {beats}/min
CSEPED: 10 min
CSEPEDS: 0 s
CSEPEW: 11.8 METS
CSEPPHR: 155 {beats}/min
LHR: 0.38
LV dias vol: 133 mL (ref 62–150)
LVSYSVOL: 64 mL
Percent HR: 90 %
RPE: 19
SDS: 3
SRS: 0
SSS: 3
TID: 1.03

## 2016-12-21 MED ORDER — TECHNETIUM TC 99M SESTAMIBI GENERIC - CARDIOLITE
10.2000 | Freq: Once | INTRAVENOUS | Status: AC | PRN
Start: 2016-12-21 — End: 2016-12-21
  Administered 2016-12-21: 10.2 via INTRAVENOUS
  Filled 2016-12-21: qty 11

## 2016-12-21 MED ORDER — TECHNETIUM TC 99M TETROFOSMIN IV KIT
31.9000 | PACK | Freq: Once | INTRAVENOUS | Status: AC | PRN
  Administered 2016-12-21: 31.9 via INTRAVENOUS
  Filled 2016-12-21: qty 32

## 2016-12-27 ENCOUNTER — Telehealth: Payer: Self-pay | Admitting: Cardiovascular Disease

## 2016-12-27 NOTE — Telephone Encounter (Signed)
New Message  Pt call requesting to speak with RN to get the results of stress test. Please call back to discuss

## 2016-12-27 NOTE — Telephone Encounter (Signed)
Patient aware of test results.

## 2016-12-28 ENCOUNTER — Encounter: Payer: Self-pay | Admitting: Podiatry

## 2016-12-28 ENCOUNTER — Ambulatory Visit (INDEPENDENT_AMBULATORY_CARE_PROVIDER_SITE_OTHER): Payer: Federal, State, Local not specified - PPO | Admitting: Podiatry

## 2016-12-28 DIAGNOSIS — M722 Plantar fascial fibromatosis: Secondary | ICD-10-CM | POA: Diagnosis not present

## 2016-12-28 MED ORDER — TRIAMCINOLONE ACETONIDE 10 MG/ML IJ SUSP
10.0000 mg | Freq: Once | INTRAMUSCULAR | Status: AC
  Administered 2016-12-28: 10 mg

## 2017-01-17 ENCOUNTER — Other Ambulatory Visit: Payer: Federal, State, Local not specified - PPO

## 2017-01-19 NOTE — Progress Notes (Signed)
Subjective:    Patient ID: Joshua Mendez, male   DOB: 46 y.o.   MRN: [id number]   HPI patient states left foot is feeling a lot better with minimal discomfort    ROS      Objective:  Physical Exam neurovascular status intact with patient's left heel doing much better with diminished discomfort with fluid buildup noted but improved     Assessment:    Plantar fasciitis left     Plan:    Instructed on physical therapy anti-inflammatories and gradual return to normal shoe gear and reappoint if symptoms persist

## 2017-01-23 ENCOUNTER — Telehealth: Payer: Self-pay | Admitting: *Deleted

## 2017-01-23 NOTE — Telephone Encounter (Signed)
Left message for patient to call the office and schedule an appointment to pick up orthotics

## 2017-03-02 DIAGNOSIS — Z0000 Encounter for general adult medical examination without abnormal findings: Secondary | ICD-10-CM | POA: Diagnosis not present

## 2017-03-02 DIAGNOSIS — R5383 Other fatigue: Secondary | ICD-10-CM | POA: Diagnosis not present

## 2017-03-02 DIAGNOSIS — E7800 Pure hypercholesterolemia, unspecified: Secondary | ICD-10-CM | POA: Diagnosis not present

## 2017-03-02 DIAGNOSIS — F322 Major depressive disorder, single episode, severe without psychotic features: Secondary | ICD-10-CM | POA: Diagnosis not present

## 2017-03-02 DIAGNOSIS — Z5181 Encounter for therapeutic drug level monitoring: Secondary | ICD-10-CM | POA: Diagnosis not present

## 2017-03-02 DIAGNOSIS — E1142 Type 2 diabetes mellitus with diabetic polyneuropathy: Secondary | ICD-10-CM | POA: Diagnosis not present

## 2017-03-02 DIAGNOSIS — E669 Obesity, unspecified: Secondary | ICD-10-CM | POA: Diagnosis not present

## 2017-03-02 DIAGNOSIS — E1165 Type 2 diabetes mellitus with hyperglycemia: Secondary | ICD-10-CM | POA: Diagnosis not present

## 2017-03-02 DIAGNOSIS — I10 Essential (primary) hypertension: Secondary | ICD-10-CM | POA: Diagnosis not present

## 2017-03-02 DIAGNOSIS — Z794 Long term (current) use of insulin: Secondary | ICD-10-CM | POA: Diagnosis not present

## 2017-03-02 DIAGNOSIS — F329 Major depressive disorder, single episode, unspecified: Secondary | ICD-10-CM | POA: Diagnosis not present

## 2017-06-08 ENCOUNTER — Encounter: Payer: Federal, State, Local not specified - PPO | Admitting: Orthotics

## 2017-06-27 ENCOUNTER — Ambulatory Visit: Payer: Federal, State, Local not specified - PPO | Admitting: Orthotics

## 2017-06-27 DIAGNOSIS — M722 Plantar fascial fibromatosis: Secondary | ICD-10-CM

## 2017-06-27 NOTE — Progress Notes (Signed)
Patient came in today to pick up custom made foot orthotics.  The goals were accomplished and the patient reported no dissatisfaction with said orthotics.  Patient was advised of breakin period and how to report any issues. 

## 2017-08-21 DIAGNOSIS — R3912 Poor urinary stream: Secondary | ICD-10-CM | POA: Diagnosis not present

## 2017-08-21 DIAGNOSIS — Z125 Encounter for screening for malignant neoplasm of prostate: Secondary | ICD-10-CM | POA: Diagnosis not present

## 2017-08-21 DIAGNOSIS — N5201 Erectile dysfunction due to arterial insufficiency: Secondary | ICD-10-CM | POA: Diagnosis not present

## 2017-08-30 DIAGNOSIS — N5201 Erectile dysfunction due to arterial insufficiency: Secondary | ICD-10-CM | POA: Diagnosis not present

## 2017-11-13 DIAGNOSIS — Z5181 Encounter for therapeutic drug level monitoring: Secondary | ICD-10-CM | POA: Diagnosis not present

## 2017-11-13 DIAGNOSIS — L989 Disorder of the skin and subcutaneous tissue, unspecified: Secondary | ICD-10-CM | POA: Diagnosis not present

## 2017-11-13 DIAGNOSIS — E1165 Type 2 diabetes mellitus with hyperglycemia: Secondary | ICD-10-CM | POA: Diagnosis not present

## 2017-11-13 DIAGNOSIS — Z794 Long term (current) use of insulin: Secondary | ICD-10-CM | POA: Diagnosis not present

## 2017-11-13 DIAGNOSIS — E669 Obesity, unspecified: Secondary | ICD-10-CM | POA: Diagnosis not present

## 2017-11-13 DIAGNOSIS — R21 Rash and other nonspecific skin eruption: Secondary | ICD-10-CM | POA: Diagnosis not present

## 2017-11-27 DIAGNOSIS — R21 Rash and other nonspecific skin eruption: Secondary | ICD-10-CM | POA: Diagnosis not present

## 2017-12-06 DIAGNOSIS — M25511 Pain in right shoulder: Secondary | ICD-10-CM | POA: Diagnosis not present

## 2017-12-18 DIAGNOSIS — M25511 Pain in right shoulder: Secondary | ICD-10-CM | POA: Diagnosis not present

## 2017-12-22 DIAGNOSIS — M25511 Pain in right shoulder: Secondary | ICD-10-CM | POA: Diagnosis not present

## 2017-12-25 DIAGNOSIS — M25511 Pain in right shoulder: Secondary | ICD-10-CM | POA: Diagnosis not present

## 2018-01-01 DIAGNOSIS — M25511 Pain in right shoulder: Secondary | ICD-10-CM | POA: Diagnosis not present

## 2018-01-17 DIAGNOSIS — R21 Rash and other nonspecific skin eruption: Secondary | ICD-10-CM | POA: Diagnosis not present

## 2018-01-29 DIAGNOSIS — M25511 Pain in right shoulder: Secondary | ICD-10-CM | POA: Diagnosis not present

## 2018-01-31 DIAGNOSIS — L308 Other specified dermatitis: Secondary | ICD-10-CM | POA: Diagnosis not present

## 2018-02-03 DIAGNOSIS — M25511 Pain in right shoulder: Secondary | ICD-10-CM | POA: Diagnosis not present

## 2018-02-12 DIAGNOSIS — M7501 Adhesive capsulitis of right shoulder: Secondary | ICD-10-CM | POA: Diagnosis not present

## 2018-03-07 DIAGNOSIS — M7501 Adhesive capsulitis of right shoulder: Secondary | ICD-10-CM | POA: Insufficient documentation

## 2018-03-08 DIAGNOSIS — M25511 Pain in right shoulder: Secondary | ICD-10-CM | POA: Diagnosis not present

## 2018-03-16 DIAGNOSIS — M25511 Pain in right shoulder: Secondary | ICD-10-CM | POA: Diagnosis not present

## 2018-03-22 IMAGING — NM NM MISC PROCEDURE
6 series · 36 of 36 positions shown · non-contrast
Comparison: none

[Series 1: stress-sum-em · 6.40mm/px · 6 of 64 frames shown]
[frame 6/64]
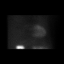
[frame 16/64]
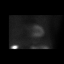
[frame 27/64]
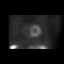
[frame 38/64]
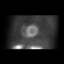
[frame 48/64]
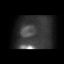
[frame 59/64]
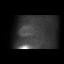

[Series 1: wbr_r-proj_st rest · 6.40mm/px · 6 of 64 frames shown]
[frame 6/64]
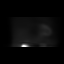
[frame 16/64]
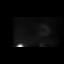
[frame 27/64]
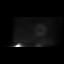
[frame 38/64]
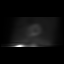
[frame 48/64]
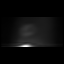
[frame 59/64]
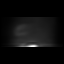

[Series 1: wbr_s-proj_st stress-sum-em · 6.40mm/px · 6 of 64 frames shown]
[frame 6/64]
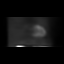
[frame 16/64]
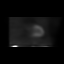
[frame 27/64]
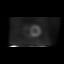
[frame 38/64]
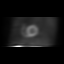
[frame 48/64]
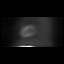
[frame 59/64]
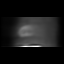

[Series 1: rest · 6.40mm/px · 6 of 64 frames shown]
[frame 6/64]
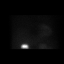
[frame 16/64]
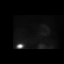
[frame 27/64]
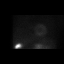
[frame 38/64]
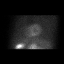
[frame 48/64]
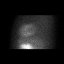
[frame 59/64]
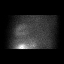

[Series 1: stress-gsp · 6.40mm/px · 6 of 512 frames shown]
[frame 43/512]
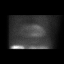
[frame 128/512]
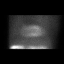
[frame 214/512]
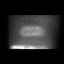
[frame 299/512]
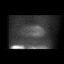
[frame 384/512]
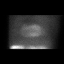
[frame 470/512]
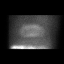

[Series 1: wbr_s-proj_st stress-gsp · 6.40mm/px · 6 of 512 frames shown]
[frame 43/512]
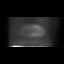
[frame 128/512]
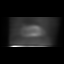
[frame 214/512]
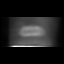
[frame 299/512]
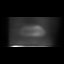
[frame 384/512]
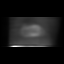
[frame 470/512]
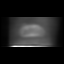

[36 of 36 positions shown; findings below may reference images not displayed]

Canned report from images found in remote index.

Refer to host system for actual result text.

## 2018-03-23 DIAGNOSIS — M25511 Pain in right shoulder: Secondary | ICD-10-CM | POA: Diagnosis not present

## 2018-04-13 DIAGNOSIS — M7501 Adhesive capsulitis of right shoulder: Secondary | ICD-10-CM | POA: Diagnosis not present

## 2018-05-24 DIAGNOSIS — M7541 Impingement syndrome of right shoulder: Secondary | ICD-10-CM | POA: Diagnosis not present

## 2018-05-24 DIAGNOSIS — Y999 Unspecified external cause status: Secondary | ICD-10-CM | POA: Diagnosis not present

## 2018-05-24 DIAGNOSIS — M94211 Chondromalacia, right shoulder: Secondary | ICD-10-CM | POA: Diagnosis not present

## 2018-05-24 DIAGNOSIS — M7501 Adhesive capsulitis of right shoulder: Secondary | ICD-10-CM | POA: Diagnosis not present

## 2018-05-24 DIAGNOSIS — S43491A Other sprain of right shoulder joint, initial encounter: Secondary | ICD-10-CM | POA: Diagnosis not present

## 2018-05-24 DIAGNOSIS — G8918 Other acute postprocedural pain: Secondary | ICD-10-CM | POA: Diagnosis not present

## 2018-05-24 DIAGNOSIS — X58XXXA Exposure to other specified factors, initial encounter: Secondary | ICD-10-CM | POA: Diagnosis not present

## 2018-05-24 DIAGNOSIS — M19011 Primary osteoarthritis, right shoulder: Secondary | ICD-10-CM | POA: Diagnosis not present

## 2018-05-24 DIAGNOSIS — S46191A Other injury of muscle, fascia and tendon of long head of biceps, right arm, initial encounter: Secondary | ICD-10-CM | POA: Diagnosis not present

## 2018-05-28 DIAGNOSIS — E1165 Type 2 diabetes mellitus with hyperglycemia: Secondary | ICD-10-CM | POA: Diagnosis not present

## 2018-05-28 DIAGNOSIS — Z79899 Other long term (current) drug therapy: Secondary | ICD-10-CM | POA: Diagnosis not present

## 2018-05-28 DIAGNOSIS — M25511 Pain in right shoulder: Secondary | ICD-10-CM | POA: Diagnosis not present

## 2018-05-28 DIAGNOSIS — E669 Obesity, unspecified: Secondary | ICD-10-CM | POA: Diagnosis not present

## 2018-05-28 DIAGNOSIS — Z794 Long term (current) use of insulin: Secondary | ICD-10-CM | POA: Diagnosis not present

## 2018-05-28 DIAGNOSIS — M7501 Adhesive capsulitis of right shoulder: Secondary | ICD-10-CM | POA: Diagnosis not present

## 2018-05-31 DIAGNOSIS — M25511 Pain in right shoulder: Secondary | ICD-10-CM | POA: Diagnosis not present

## 2018-06-07 DIAGNOSIS — M25511 Pain in right shoulder: Secondary | ICD-10-CM | POA: Diagnosis not present

## 2018-06-11 DIAGNOSIS — M25511 Pain in right shoulder: Secondary | ICD-10-CM | POA: Diagnosis not present

## 2018-06-14 ENCOUNTER — Ambulatory Visit: Payer: Federal, State, Local not specified - PPO | Admitting: Podiatry

## 2018-06-14 ENCOUNTER — Encounter: Payer: Self-pay | Admitting: Podiatry

## 2018-06-14 DIAGNOSIS — E1149 Type 2 diabetes mellitus with other diabetic neurological complication: Secondary | ICD-10-CM | POA: Diagnosis not present

## 2018-06-14 DIAGNOSIS — M2020 Hallux rigidus, unspecified foot: Secondary | ICD-10-CM | POA: Diagnosis not present

## 2018-06-14 DIAGNOSIS — L84 Corns and callosities: Secondary | ICD-10-CM | POA: Diagnosis not present

## 2018-06-14 DIAGNOSIS — M205X9 Other deformities of toe(s) (acquired), unspecified foot: Secondary | ICD-10-CM

## 2018-06-14 NOTE — Progress Notes (Signed)
This patient presents to the office with chief complaint of a bloodied callus on the big toe left foot.  He says that it was noted approximately 3 weeks ago.  He says he was referred to the office by his medical doctor.  He says he is not having any pain or discomfort at the site of the callus.  No drainage noted at the site of the callus.  This patient states that he is type II diabetic and is taking gabapentin.  He has provided no self treatment nor sought any professional help.  He presents the office today for an evaluation and treatment of this callus left big toe.  General Appearance  Alert, conversant and in no acute stress.  Vascular  Dorsalis pedis and posterior tibial  pulses are palpable  bilaterally.  Capillary return is within normal limits  bilaterally. Temperature is within normal limits  bilaterally.  Neurologic  Senn-Weinstein monofilament wire test within normal limits  bilaterally. Muscle power within normal limits bilaterally.  Nails Normal nails noted with no evidence of thick mycotic nails.  Orthopedic  No limitations of motion  feet .  No crepitus or effusions noted.  Mild HAV first MPJ bilaterally.  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  Skin  normotropic skin with no porokeratosis noted bilaterally.  No signs of infections or ulcers noted.  Pinch callus noted on the medial aspect hallux bilaterally with the left greater than the right.  The pinch callus on the left hallux is a hemorrhagic callus.  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  Debride pinch callus.  Discussed this condition with this patient.  Told him the pinch callus forms due to the function of the big toe joint.  Patient was instructed to use pumas stone to his callus as well as padding which was dispensed today.  Return to clinic as needed.  Told him that surgical intervention is the permanent correction of this problem.   Gardiner Barefoot DPM

## 2018-06-18 DIAGNOSIS — M25511 Pain in right shoulder: Secondary | ICD-10-CM | POA: Diagnosis not present

## 2018-06-21 DIAGNOSIS — M25511 Pain in right shoulder: Secondary | ICD-10-CM | POA: Diagnosis not present

## 2018-06-25 DIAGNOSIS — M25511 Pain in right shoulder: Secondary | ICD-10-CM | POA: Diagnosis not present

## 2018-06-29 DIAGNOSIS — M25511 Pain in right shoulder: Secondary | ICD-10-CM | POA: Diagnosis not present

## 2018-07-10 DIAGNOSIS — M25511 Pain in right shoulder: Secondary | ICD-10-CM | POA: Diagnosis not present

## 2018-07-13 DIAGNOSIS — M25511 Pain in right shoulder: Secondary | ICD-10-CM | POA: Diagnosis not present

## 2018-07-17 DIAGNOSIS — M25511 Pain in right shoulder: Secondary | ICD-10-CM | POA: Diagnosis not present

## 2018-07-24 DIAGNOSIS — M25511 Pain in right shoulder: Secondary | ICD-10-CM | POA: Diagnosis not present

## 2018-07-27 DIAGNOSIS — M25511 Pain in right shoulder: Secondary | ICD-10-CM | POA: Diagnosis not present

## 2018-07-31 DIAGNOSIS — M25511 Pain in right shoulder: Secondary | ICD-10-CM | POA: Diagnosis not present

## 2018-08-07 DIAGNOSIS — M25511 Pain in right shoulder: Secondary | ICD-10-CM | POA: Diagnosis not present

## 2018-08-21 DIAGNOSIS — M25511 Pain in right shoulder: Secondary | ICD-10-CM | POA: Diagnosis not present

## 2018-08-24 DIAGNOSIS — M25511 Pain in right shoulder: Secondary | ICD-10-CM | POA: Diagnosis not present

## 2018-08-28 DIAGNOSIS — M25511 Pain in right shoulder: Secondary | ICD-10-CM | POA: Diagnosis not present

## 2018-08-29 DIAGNOSIS — Z5181 Encounter for therapeutic drug level monitoring: Secondary | ICD-10-CM | POA: Diagnosis not present

## 2018-08-29 DIAGNOSIS — Z794 Long term (current) use of insulin: Secondary | ICD-10-CM | POA: Diagnosis not present

## 2018-08-29 DIAGNOSIS — E1165 Type 2 diabetes mellitus with hyperglycemia: Secondary | ICD-10-CM | POA: Diagnosis not present

## 2018-08-29 DIAGNOSIS — E669 Obesity, unspecified: Secondary | ICD-10-CM | POA: Diagnosis not present

## 2018-08-31 DIAGNOSIS — M25511 Pain in right shoulder: Secondary | ICD-10-CM | POA: Diagnosis not present

## 2018-09-04 DIAGNOSIS — M25511 Pain in right shoulder: Secondary | ICD-10-CM | POA: Diagnosis not present

## 2018-09-07 DIAGNOSIS — M25511 Pain in right shoulder: Secondary | ICD-10-CM | POA: Diagnosis not present

## 2018-09-18 DIAGNOSIS — M25511 Pain in right shoulder: Secondary | ICD-10-CM | POA: Diagnosis not present

## 2018-09-25 DIAGNOSIS — M25511 Pain in right shoulder: Secondary | ICD-10-CM | POA: Diagnosis not present

## 2018-10-02 DIAGNOSIS — M25511 Pain in right shoulder: Secondary | ICD-10-CM | POA: Diagnosis not present

## 2018-10-03 DIAGNOSIS — H5203 Hypermetropia, bilateral: Secondary | ICD-10-CM | POA: Diagnosis not present

## 2018-10-04 DIAGNOSIS — E113591 Type 2 diabetes mellitus with proliferative diabetic retinopathy without macular edema, right eye: Secondary | ICD-10-CM | POA: Diagnosis not present

## 2018-10-04 DIAGNOSIS — E113593 Type 2 diabetes mellitus with proliferative diabetic retinopathy without macular edema, bilateral: Secondary | ICD-10-CM | POA: Diagnosis not present

## 2018-10-11 DIAGNOSIS — E113592 Type 2 diabetes mellitus with proliferative diabetic retinopathy without macular edema, left eye: Secondary | ICD-10-CM | POA: Diagnosis not present

## 2018-10-17 DIAGNOSIS — M25511 Pain in right shoulder: Secondary | ICD-10-CM | POA: Diagnosis not present

## 2018-11-09 DIAGNOSIS — E113591 Type 2 diabetes mellitus with proliferative diabetic retinopathy without macular edema, right eye: Secondary | ICD-10-CM | POA: Diagnosis not present

## 2018-11-16 DIAGNOSIS — E113592 Type 2 diabetes mellitus with proliferative diabetic retinopathy without macular edema, left eye: Secondary | ICD-10-CM | POA: Diagnosis not present

## 2019-01-25 DIAGNOSIS — R3912 Poor urinary stream: Secondary | ICD-10-CM | POA: Diagnosis not present

## 2019-01-25 DIAGNOSIS — Z125 Encounter for screening for malignant neoplasm of prostate: Secondary | ICD-10-CM | POA: Diagnosis not present

## 2019-01-25 DIAGNOSIS — N5201 Erectile dysfunction due to arterial insufficiency: Secondary | ICD-10-CM | POA: Diagnosis not present

## 2019-02-08 DIAGNOSIS — E113593 Type 2 diabetes mellitus with proliferative diabetic retinopathy without macular edema, bilateral: Secondary | ICD-10-CM | POA: Diagnosis not present

## 2019-02-08 DIAGNOSIS — E113591 Type 2 diabetes mellitus with proliferative diabetic retinopathy without macular edema, right eye: Secondary | ICD-10-CM | POA: Diagnosis not present

## 2019-02-08 DIAGNOSIS — H4311 Vitreous hemorrhage, right eye: Secondary | ICD-10-CM | POA: Diagnosis not present

## 2019-02-19 DIAGNOSIS — L308 Other specified dermatitis: Secondary | ICD-10-CM | POA: Diagnosis not present

## 2019-04-18 DIAGNOSIS — Z5181 Encounter for therapeutic drug level monitoring: Secondary | ICD-10-CM | POA: Diagnosis not present

## 2019-04-18 DIAGNOSIS — E1165 Type 2 diabetes mellitus with hyperglycemia: Secondary | ICD-10-CM | POA: Diagnosis not present

## 2019-04-18 DIAGNOSIS — Z794 Long term (current) use of insulin: Secondary | ICD-10-CM | POA: Diagnosis not present

## 2019-04-18 DIAGNOSIS — E669 Obesity, unspecified: Secondary | ICD-10-CM | POA: Diagnosis not present

## 2019-04-19 DIAGNOSIS — Z79899 Other long term (current) drug therapy: Secondary | ICD-10-CM | POA: Diagnosis not present

## 2019-04-19 DIAGNOSIS — E1165 Type 2 diabetes mellitus with hyperglycemia: Secondary | ICD-10-CM | POA: Diagnosis not present

## 2019-04-30 DIAGNOSIS — E113591 Type 2 diabetes mellitus with proliferative diabetic retinopathy without macular edema, right eye: Secondary | ICD-10-CM | POA: Diagnosis not present

## 2019-04-30 DIAGNOSIS — E113592 Type 2 diabetes mellitus with proliferative diabetic retinopathy without macular edema, left eye: Secondary | ICD-10-CM | POA: Diagnosis not present

## 2019-04-30 DIAGNOSIS — E113593 Type 2 diabetes mellitus with proliferative diabetic retinopathy without macular edema, bilateral: Secondary | ICD-10-CM | POA: Diagnosis not present

## 2019-07-23 DIAGNOSIS — H4311 Vitreous hemorrhage, right eye: Secondary | ICD-10-CM | POA: Diagnosis not present

## 2019-07-23 DIAGNOSIS — E113593 Type 2 diabetes mellitus with proliferative diabetic retinopathy without macular edema, bilateral: Secondary | ICD-10-CM | POA: Diagnosis not present

## 2019-08-20 DIAGNOSIS — Z5181 Encounter for therapeutic drug level monitoring: Secondary | ICD-10-CM | POA: Diagnosis not present

## 2019-08-20 DIAGNOSIS — E669 Obesity, unspecified: Secondary | ICD-10-CM | POA: Diagnosis not present

## 2019-08-20 DIAGNOSIS — E113593 Type 2 diabetes mellitus with proliferative diabetic retinopathy without macular edema, bilateral: Secondary | ICD-10-CM | POA: Diagnosis not present

## 2019-08-20 DIAGNOSIS — Z794 Long term (current) use of insulin: Secondary | ICD-10-CM | POA: Diagnosis not present

## 2019-11-19 DIAGNOSIS — E113593 Type 2 diabetes mellitus with proliferative diabetic retinopathy without macular edema, bilateral: Secondary | ICD-10-CM | POA: Diagnosis not present

## 2019-11-29 DIAGNOSIS — M25511 Pain in right shoulder: Secondary | ICD-10-CM | POA: Diagnosis not present

## 2020-01-13 DIAGNOSIS — R202 Paresthesia of skin: Secondary | ICD-10-CM | POA: Diagnosis not present

## 2020-01-13 DIAGNOSIS — R21 Rash and other nonspecific skin eruption: Secondary | ICD-10-CM | POA: Diagnosis not present

## 2020-01-27 DIAGNOSIS — R21 Rash and other nonspecific skin eruption: Secondary | ICD-10-CM | POA: Diagnosis not present

## 2020-03-02 DIAGNOSIS — L239 Allergic contact dermatitis, unspecified cause: Secondary | ICD-10-CM | POA: Diagnosis not present

## 2020-03-06 DIAGNOSIS — R21 Rash and other nonspecific skin eruption: Secondary | ICD-10-CM | POA: Diagnosis not present

## 2020-04-21 DIAGNOSIS — Z125 Encounter for screening for malignant neoplasm of prostate: Secondary | ICD-10-CM | POA: Diagnosis not present

## 2020-04-28 DIAGNOSIS — R3912 Poor urinary stream: Secondary | ICD-10-CM | POA: Diagnosis not present

## 2020-04-28 DIAGNOSIS — N5201 Erectile dysfunction due to arterial insufficiency: Secondary | ICD-10-CM | POA: Diagnosis not present

## 2020-05-05 DIAGNOSIS — R55 Syncope and collapse: Secondary | ICD-10-CM | POA: Diagnosis not present

## 2020-05-05 DIAGNOSIS — E1142 Type 2 diabetes mellitus with diabetic polyneuropathy: Secondary | ICD-10-CM | POA: Diagnosis not present

## 2020-05-05 DIAGNOSIS — E7800 Pure hypercholesterolemia, unspecified: Secondary | ICD-10-CM | POA: Diagnosis not present

## 2020-05-05 DIAGNOSIS — I10 Essential (primary) hypertension: Secondary | ICD-10-CM | POA: Diagnosis not present

## 2020-05-05 DIAGNOSIS — E1165 Type 2 diabetes mellitus with hyperglycemia: Secondary | ICD-10-CM | POA: Diagnosis not present

## 2020-05-10 NOTE — Progress Notes (Signed)
Cardiology Office Note:    Date:  05/15/2020   ID:  Joshua Mendez, DOB 1970-09-15, MRN [id number]  PCP:  Wenda Low, MD  [facility] Cardiologist:  No primary care provider on file.  [facility] Electrophysiologist:  None   Referring MD: Wenda Low, MD    History of Present Illness:    Joshua Mendez is a 49 y.o. male with a hx of DMI on insulin pump, elevated lipids, HTN and history of schatzkis ring with esophageal dilation who was referred by Dr. Lysle Rubens for episode of syncope and intermittent chest pain.  Dr. Glenna Durand note dated 05/05/20 reviewed. Patient had syncopal episode while at the golf range when bending down to pick up a ball. Had similar episode about 6 months ago after shooting a gun and bending over to pick up the casing. During the episodes, had preceding lightheadedness and dizziness. Not hypoglycemic during the episode (blood glucose reportedly 133). ECG there revealed NSR, no blocks, normal QTc.  Dr. Lysle Rubens thought symptoms were likely due to orthostasis and his lisinopril was stopped. Wilder Glade was subsequently stopped by endocrinologist with no recurrence of symptoms.   Today, patient states he has not had further symptoms since stopping medications. Denies any exertional lightheadedness, dizziness, chest pain, or shortness of breath. No palpitations.   Nuclear stress 2018: Normal perfusion EF 45-54%.  TTE 12/21/16: Mildly dilated LV, normal LVEF 55-60%. Normal wall motion.  Labs 05/05/20 notable for A1C 8.1, TSH 1.26, Glucose 126, LDL 65, HDL 44, TC 132, HgB 14.5  Past Medical History:  Diagnosis Date  . Adhesive capsulitis of left shoulder    frozen shoulder  . Allergic rhinitis   . Anxiety   . Depressed mood   . Depression   . Diabetes mellitus without complication ([place])   . ED (erectile dysfunction) 01/2015  . Eosinophilic esophagitis   . Esophageal dysphagia   . Family history of colon cancer   . GERD (gastroesophageal reflux disease)   . H/O  adenomatous polyp of colon   . Hypertension   . Long term (current) use of insulin ([place])   . Neck pain   . Obesity   . Pure hypercholesterolemia   . Shortness of breath dyspnea    with exercise  . Sinusitis    unspecified  . Type 2 diabetes mellitus with diabetic polyneuropathy, with long-term current use of insulin ([place])    unspecified  . 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 COLONOSCOPY WITH PROPOFOL N/A 12/07/2015   Procedure: COLONOSCOPY WITH PROPOFOL;  Surgeon: Garlan Fair, MD;  Location: [facility];  Service: Endoscopy;  Laterality: N/A;  . ESOPHAGOGASTRODUODENOSCOPY (EGD) WITH PROPOFOL N/A 12/07/2015   Procedure: ESOPHAGOGASTRODUODENOSCOPY (EGD) WITH PROPOFOL;  Surgeon: Garlan Fair, MD;  Location: [facility];  Service: Endoscopy;  Laterality: N/A;  . NO PAST SURGERIES      Current Medications: Current Meds  Medication Sig  . Alogliptin Benzoate 25 MG TABS alogliptin 25 mg tablet  . BD ULTRA-FINE PEN NEEDLES 29G X 12.7MM MISC   . Continuous Blood Gluc Sensor (FREESTYLE LIBRE 14 DAY SENSOR) MISC   . fluticasone (FLONASE) 50 MCG/ACT nasal spray Place 2 sprays into both nostrils daily.  Marland Kitchen gabapentin (NEURONTIN) 100 MG capsule Take 100 mg by mouth at bedtime.   . metFORMIN (GLUCOPHAGE-XR) 500 MG 24 hr tablet Take 1,000 mg by mouth 2 (two) times daily.  Marland Kitchen  OZEMPIC, 0.25 OR 0.5 MG/DOSE, 2 MG/1.5ML SOPN Inject into the skin.  . sildenafil (VIAGRA) 100 MG tablet sildenafil 100 mg tablet  . TRESIBA FLEXTOUCH 100 UNIT/ML FlexTouch Pen Inject into the skin.  . [DISCONTINUED] insulin aspart (NOVOLOG) 100 UNIT/ML injection Inject 40 Units into the skin continuous.     Allergies:   Penicillins, Latex, and Neosporin [neomycin-bacitracin zn-polymyx]   Social History   Socioeconomic History  . Marital status: Married    Spouse name: Not on file  . Number  of children: 1  . Years of education: Not on file  . Highest education level: Not on file  Occupational History  . Not on file  Tobacco Use  . Smoking status: Never Smoker  . Smokeless tobacco: Never Used  Vaping Use  . Vaping Use: Never used  Substance and Sexual Activity  . Alcohol use: Yes    Comment: rarely  . Drug use: No  . Sexual activity: Not on file  Other Topics Concern  . Not on file  Social History Narrative  . Not on file   Social Determinants of Health   Financial Resource Strain:   . Difficulty of Paying Living Expenses: Not on file  Food Insecurity:   . Worried About Charity fundraiser in the Last Year: Not on file  . Ran Out of Food in the Last Year: Not on file  Transportation Needs:   . Lack of Transportation (Medical): Not on file  . Lack of Transportation (Non-Medical): Not on file  Physical Activity:   . Days of Exercise per Week: Not on file  . Minutes of Exercise per Session: Not on file  Stress:   . Feeling of Stress : Not on file  Social Connections:   . Frequency of Communication with Friends and Family: Not on file  . Frequency of Social Gatherings with Friends and Family: Not on file  . Attends Religious Services: Not on file  . Active Member of Clubs or Organizations: Not on file  . Attends Archivist Meetings: Not on file  . Marital Status: Not on file     Family History: The patient's family history is not on file.  ROS:   Please see the history of present illness.     All other systems reviewed and are negative.  EKGs/Labs/Other Studies Reviewed:    The following studies were reviewed today: Nuclear stress 12/21/16 Myocardial perfusion is normal.   The study is normal.   This is a low risk study.  Overall left ventricular systolic function was normal.    LV cavity size is normal.  Nuclear stress EF:  52%.  The left ventricular ejection fraction is mildly decreased (45-54%).   There is no prior study for  comparison.  TTE 12/21/16: Study Conclusions   - Left ventricle: The cavity size was mildly dilated. Wall  thickness was normal. Systolic function was normal. The estimated  ejection fraction was in the range of 55% to 60%. Wall motion was  normal; there were no regional wall motion abnormalities. Left  ventricular diastolic function parameters were normal.   Impressions:   - Normal LV systolic and diastolic function; mild LVE.   EKG:  EKG is  ordered today.  The ekg ordered today demonstrates NSR with HR 87. No block. No ischemic changes. Normal QTc.   Recent Labs: Labs 05/05/20 notable for A1C 8.1, TSH 1.26, Glucose 126, HgB 14.5, BUN 19, Cr 1.19, WBC 8.0  Recent Lipid Panel LDL 65,  HDL 44, TC 132, TG 129  Physical Exam:    VS:  BP 123/85   Pulse 87   Ht 6\' 1"  (1.854 m)   Wt 254 lb 12.8 oz (115.6 kg)   SpO2 96%   BMI 33.62 kg/m     Wt Readings from Last 3 Encounters:  05/15/20 254 lb 12.8 oz (115.6 kg)  12/21/16 255 lb (115.7 kg)  12/06/16 255 lb 12.8 oz (116 kg)     GEN:  Well nourished, well developed in no acute distress HEENT: Normal NECK: No JVD; No carotid bruits LYMPHATICS: No lymphadenopathy CARDIAC: RRR, no murmurs, rubs, gallops RESPIRATORY:  Clear to auscultation without rales, wheezing or rhonchi  ABDOMEN: Soft, non-tender, non-distended MUSCULOSKELETAL:  No edema; No deformity  SKIN: Warm and dry NEUROLOGIC:  Alert and oriented x 3 PSYCHIATRIC:  Normal affect   ASSESSMENT:    1. Syncope and collapse   2. Dizziness   3. Essential hypertension   4. Uncontrolled type 2 diabetes mellitus with hyperglycemia (HCC)    PLAN:    In order of problems listed above:  #Syncope: Likely orthostatic hypotension given syncope when bending over while golfing and bending over to pick up gun casing. No exertional symptoms. Last TTE normal without AS or LVH to suggest HOCM. Myoview 2018 without ischemia. Symptoms resolved with stopping lisinopril and  farxiga. -Agree with holding lisinopril and farxiga for now -Monitor blood pressures at home and if consistently SBP>130, can add back low dose ACE/ARB as would benefit from this long-term -Maintain good hydration especially in the setting of DMII on insulin -Discussed compression socks if symptoms continue to occur and the need to lay down with feet up if feels like he is going to faint  #DMII: On insulin pen. Last A1C 8.1. -Follows with Endocrinology -On rosuvastatin 5mg  daily  #HTN:  -Holding lisinopril for now given orthostasis -BP well controlled today at 123/85 -Will monitor BP at home and if SBP consistently >130, can add back small dose of ACE or ARB  #HLD: -Well controlled LDL 65 -Continue rosuvastatin 5mg ; will need lifelong statin due to DMII  Medication Adjustments/Labs and Tests Ordered: Current medicines are reviewed at length with the patient today.  Concerns regarding medicines are outlined above.  Orders Placed This Encounter  Procedures  . EKG 12-Lead   No orders of the defined types were placed in this encounter.   Patient Instructions  Medication Instructions:  Your physician recommends that you continue on your current medications as directed. Please refer to the Current Medication list given to you today.  *If you need a refill on your cardiac medications before your next appointment, please call your pharmacy*   Lab Work: none If you have labs (blood work) drawn today and your tests are completely normal, you will receive your results only by: Marland Kitchen MyChart Message (if you have MyChart) OR . A paper copy in the mail If you have any lab test that is abnormal or we need to change your treatment, we will call you to review the results.   Testing/Procedures: none   Follow-Up: At [facility], you and your health needs are our priority.  As part of our continuing mission to provide you with exceptional heart care, we have created designated Provider  Care Teams.  These Care Teams include your primary Cardiologist (physician) and Advanced Practice Providers (APPs -  Physician Assistants and Nurse Practitioners) who all work together to provide you with the care you need, when you need  it.  We recommend signing up for the patient portal called "MyChart".  Sign up information is provided on this After Visit Summary.  MyChart is used to connect with patients for Virtual Visits (Telemedicine).  Patients are able to view lab/test results, encounter notes, upcoming appointments, etc.  Non-urgent messages can be sent to your provider as well.   To learn more about what you can do with MyChart, go to [email or web address].    Your next appointment:  As needed  Other Instructions  Orthostatic Hypotension Blood pressure is a measurement of how strongly, or weakly, your blood is pressing against the walls of your arteries. Orthostatic hypotension is a sudden drop in blood pressure that happens when you quickly change positions, such as when you get up from sitting or lying down. Arteries are blood vessels that carry blood from your heart throughout your body. When blood pressure is too low, you may not get enough blood to your brain or to the rest of your organs. This can cause weakness, light-headedness, rapid heartbeat, and fainting. This can last for just a few seconds or for up to a few minutes. Orthostatic hypotension is usually not a serious problem. However, if it happens frequently or gets worse, it may be a sign of something more serious. What are the causes? This condition may be caused by:  Sudden changes in posture, such as standing up quickly after you have been sitting or lying down.  Blood loss.  Loss of body fluids (dehydration).  Heart problems.  Hormone (endocrine) problems.  Pregnancy.  Severe infection.  Lack of certain nutrients.  Severe allergic reactions (anaphylaxis).  Certain medicines, such as blood pressure  medicine or medicines that make the body lose excess fluids (diuretics). Sometimes, this condition can be caused by not taking medicine as directed, such as taking too much of a certain medicine. What increases the risk? The following factors may make you more likely to develop this condition:  Age. Risk increases as you get older.  Conditions that affect the heart or the central nervous system.  Taking certain medicines, such as blood pressure medicine or diuretics.  Being pregnant. What are the signs or symptoms? Symptoms of this condition may include:  Weakness.  Light-headedness.  Dizziness.  Blurred vision.  Fatigue.  Rapid heartbeat.  Fainting, in severe cases. How is this diagnosed? This condition is diagnosed based on:  Your medical history.  Your symptoms.  Your blood pressure measurement. Your health care provider will check your blood pressure when you are: ? Lying down. ? Sitting. ? Standing. A blood pressure reading is recorded as two numbers, such as "120 over 80" (or 120/80). The first ("top") number is called the systolic pressure. It is a measure of the pressure in your arteries as your heart beats. The second ("bottom") number is called the diastolic pressure. It is a measure of the pressure in your arteries when your heart relaxes between beats. Blood pressure is measured in a unit called mm Hg. Healthy blood pressure for most adults is 120/80. If your blood pressure is below 90/60, you may be diagnosed with hypotension. Other information or tests that may be used to diagnose orthostatic hypotension include:  Your other vital signs, such as your heart rate and temperature.  Blood tests.  Tilt table test. For this test, you will be safely secured to a table that moves you from a lying position to an upright position. Your heart rhythm and blood pressure will be monitored during the  test. How is this treated? This condition may be treated  by:  Changing your diet. This may involve eating more salt (sodium) or drinking more water.  Taking medicines to raise your blood pressure.  Changing the dosage of certain medicines you are taking that might be lowering your blood pressure.  Wearing compression stockings. These stockings help to prevent blood clots and reduce swelling in your legs. In some cases, you may need to go to the hospital for:  Fluid replacement. This means you will receive fluids through an IV.  Blood replacement. This means you will receive donated blood through an IV (transfusion).  Treating an infection or heart problems, if this applies.  Monitoring. You may need to be monitored while medicines that you are taking wear off. Follow these instructions at home: Eating and drinking   Drink enough fluid to keep your urine pale yellow.  Eat a healthy diet, and follow instructions from your health care provider about eating or drinking restrictions. A healthy diet includes: ? Fresh fruits and vegetables. ? Whole grains. ? Lean meats. ? Low-fat dairy products.  Eat extra salt only as directed. Do not add extra salt to your diet unless your health care provider told you to do that.  Eat frequent, small meals.  Avoid standing up suddenly after eating. Medicines  Take over-the-counter and prescription medicines only as told by your health care provider. ? Follow instructions from your health care provider about changing the dosage of your current medicines, if this applies. ? Do not stop or adjust any of your medicines on your own. General instructions   Wear compression stockings as told by your health care provider.  Get up slowly from lying down or sitting positions. This gives your blood pressure a chance to adjust.  Avoid hot showers and excessive heat as directed by your health care provider.  Return to your normal activities as told by your health care provider. Ask your health care provider  what activities are safe for you.  Do not use any products that contain nicotine or tobacco, such as cigarettes, e-cigarettes, and chewing tobacco. If you need help quitting, ask your health care provider.  Keep all follow-up visits as told by your health care provider. This is important. Contact a health care provider if you:  Vomit.  Have diarrhea.  Have a fever for more than 2-3 days.  Feel more thirsty than usual.  Feel weak and tired. Get help right away if you:  Have chest pain.  Have a fast or irregular heartbeat.  Develop numbness in any part of your body.  Cannot move your arms or your legs.  Have trouble speaking.  Become sweaty or feel light-headed.  Faint.  Feel short of breath.  Have trouble staying awake.  Feel confused. Summary  Orthostatic hypotension is a sudden drop in blood pressure that happens when you quickly change positions.  Orthostatic hypotension is usually not a serious problem.  It is diagnosed by having your blood pressure taken lying down, sitting, and then standing.  It may be treated by changing your diet or adjusting your medicines. This information is not intended to replace advice given to you by your health care provider. Make sure you discuss any questions you have with your health care provider. Document Revised: 02/01/2018 Document Reviewed: 02/01/2018 Elsevier Patient Education  2020 [place], Freada Bergeron, MD  05/15/2020 9:24 AM    [place]

## 2020-05-11 DIAGNOSIS — E669 Obesity, unspecified: Secondary | ICD-10-CM | POA: Diagnosis not present

## 2020-05-11 DIAGNOSIS — Z794 Long term (current) use of insulin: Secondary | ICD-10-CM | POA: Diagnosis not present

## 2020-05-11 DIAGNOSIS — E113593 Type 2 diabetes mellitus with proliferative diabetic retinopathy without macular edema, bilateral: Secondary | ICD-10-CM | POA: Diagnosis not present

## 2020-05-11 DIAGNOSIS — R55 Syncope and collapse: Secondary | ICD-10-CM | POA: Diagnosis not present

## 2020-05-14 DIAGNOSIS — E113592 Type 2 diabetes mellitus with proliferative diabetic retinopathy without macular edema, left eye: Secondary | ICD-10-CM | POA: Insufficient documentation

## 2020-05-15 ENCOUNTER — Ambulatory Visit (INDEPENDENT_AMBULATORY_CARE_PROVIDER_SITE_OTHER): Payer: Federal, State, Local not specified - PPO | Admitting: Cardiology

## 2020-05-15 ENCOUNTER — Encounter: Payer: Self-pay | Admitting: Cardiology

## 2020-05-15 ENCOUNTER — Other Ambulatory Visit: Payer: Self-pay

## 2020-05-15 VITALS — BP 123/85 | HR 87 | Ht 73.0 in | Wt 254.8 lb

## 2020-05-15 DIAGNOSIS — R42 Dizziness and giddiness: Secondary | ICD-10-CM | POA: Diagnosis not present

## 2020-05-15 DIAGNOSIS — E1165 Type 2 diabetes mellitus with hyperglycemia: Secondary | ICD-10-CM | POA: Diagnosis not present

## 2020-05-15 DIAGNOSIS — R55 Syncope and collapse: Secondary | ICD-10-CM | POA: Diagnosis not present

## 2020-05-15 DIAGNOSIS — I10 Essential (primary) hypertension: Secondary | ICD-10-CM | POA: Diagnosis not present

## 2020-05-15 NOTE — Patient Instructions (Signed)
Medication Instructions:  Your physician recommends that you continue on your current medications as directed. Please refer to the Current Medication list given to you today.  *If you need a refill on your cardiac medications before your next appointment, please call your pharmacy*   Lab Work: none If you have labs (blood work) drawn today and your tests are completely normal, you will receive your results only by: Marland Kitchen MyChart Message (if you have MyChart) OR . A paper copy in the mail If you have any lab test that is abnormal or we need to change your treatment, we will call you to review the results.   Testing/Procedures: none   Follow-Up: At [facility], you and your health needs are our priority.  As part of our continuing mission to provide you with exceptional heart care, we have created designated Provider Care Teams.  These Care Teams include your primary Cardiologist (physician) and Advanced Practice Providers (APPs -  Physician Assistants and Nurse Practitioners) who all work together to provide you with the care you need, when you need it.  We recommend signing up for the patient portal called "MyChart".  Sign up information is provided on this After Visit Summary.  MyChart is used to connect with patients for Virtual Visits (Telemedicine).  Patients are able to view lab/test results, encounter notes, upcoming appointments, etc.  Non-urgent messages can be sent to your provider as well.   To learn more about what you can do with MyChart, go to [email or web address].    Your next appointment:  As needed  Other Instructions  Orthostatic Hypotension Blood pressure is a measurement of how strongly, or weakly, your blood is pressing against the walls of your arteries. Orthostatic hypotension is a sudden drop in blood pressure that happens when you quickly change positions, such as when you get up from sitting or lying down. Arteries are blood vessels that carry blood from your  heart throughout your body. When blood pressure is too low, you may not get enough blood to your brain or to the rest of your organs. This can cause weakness, light-headedness, rapid heartbeat, and fainting. This can last for just a few seconds or for up to a few minutes. Orthostatic hypotension is usually not a serious problem. However, if it happens frequently or gets worse, it may be a sign of something more serious. What are the causes? This condition may be caused by:  Sudden changes in posture, such as standing up quickly after you have been sitting or lying down.  Blood loss.  Loss of body fluids (dehydration).  Heart problems.  Hormone (endocrine) problems.  Pregnancy.  Severe infection.  Lack of certain nutrients.  Severe allergic reactions (anaphylaxis).  Certain medicines, such as blood pressure medicine or medicines that make the body lose excess fluids (diuretics). Sometimes, this condition can be caused by not taking medicine as directed, such as taking too much of a certain medicine. What increases the risk? The following factors may make you more likely to develop this condition:  Age. Risk increases as you get older.  Conditions that affect the heart or the central nervous system.  Taking certain medicines, such as blood pressure medicine or diuretics.  Being pregnant. What are the signs or symptoms? Symptoms of this condition may include:  Weakness.  Light-headedness.  Dizziness.  Blurred vision.  Fatigue.  Rapid heartbeat.  Fainting, in severe cases. How is this diagnosed? This condition is diagnosed based on:  Your medical history.  Your symptoms.  Your blood pressure measurement. Your health care provider will check your blood pressure when you are: ? Lying down. ? Sitting. ? Standing. A blood pressure reading is recorded as two numbers, such as "120 over 80" (or 120/80). The first ("top") number is called the systolic pressure. It is a  measure of the pressure in your arteries as your heart beats. The second ("bottom") number is called the diastolic pressure. It is a measure of the pressure in your arteries when your heart relaxes between beats. Blood pressure is measured in a unit called mm Hg. Healthy blood pressure for most adults is 120/80. If your blood pressure is below 90/60, you may be diagnosed with hypotension. Other information or tests that may be used to diagnose orthostatic hypotension include:  Your other vital signs, such as your heart rate and temperature.  Blood tests.  Tilt table test. For this test, you will be safely secured to a table that moves you from a lying position to an upright position. Your heart rhythm and blood pressure will be monitored during the test. How is this treated? This condition may be treated by:  Changing your diet. This may involve eating more salt (sodium) or drinking more water.  Taking medicines to raise your blood pressure.  Changing the dosage of certain medicines you are taking that might be lowering your blood pressure.  Wearing compression stockings. These stockings help to prevent blood clots and reduce swelling in your legs. In some cases, you may need to go to the hospital for:  Fluid replacement. This means you will receive fluids through an IV.  Blood replacement. This means you will receive donated blood through an IV (transfusion).  Treating an infection or heart problems, if this applies.  Monitoring. You may need to be monitored while medicines that you are taking wear off. Follow these instructions at home: Eating and drinking   Drink enough fluid to keep your urine pale yellow.  Eat a healthy diet, and follow instructions from your health care provider about eating or drinking restrictions. A healthy diet includes: ? Fresh fruits and vegetables. ? Whole grains. ? Lean meats. ? Low-fat dairy products.  Eat extra salt only as directed. Do not add  extra salt to your diet unless your health care provider told you to do that.  Eat frequent, small meals.  Avoid standing up suddenly after eating. Medicines  Take over-the-counter and prescription medicines only as told by your health care provider. ? Follow instructions from your health care provider about changing the dosage of your current medicines, if this applies. ? Do not stop or adjust any of your medicines on your own. General instructions   Wear compression stockings as told by your health care provider.  Get up slowly from lying down or sitting positions. This gives your blood pressure a chance to adjust.  Avoid hot showers and excessive heat as directed by your health care provider.  Return to your normal activities as told by your health care provider. Ask your health care provider what activities are safe for you.  Do not use any products that contain nicotine or tobacco, such as cigarettes, e-cigarettes, and chewing tobacco. If you need help quitting, ask your health care provider.  Keep all follow-up visits as told by your health care provider. This is important. Contact a health care provider if you:  Vomit.  Have diarrhea.  Have a fever for more than 2-3 days.  Feel more thirsty than usual.  Feel  weak and tired. Get help right away if you:  Have chest pain.  Have a fast or irregular heartbeat.  Develop numbness in any part of your body.  Cannot move your arms or your legs.  Have trouble speaking.  Become sweaty or feel light-headed.  Faint.  Feel short of breath.  Have trouble staying awake.  Feel confused. Summary  Orthostatic hypotension is a sudden drop in blood pressure that happens when you quickly change positions.  Orthostatic hypotension is usually not a serious problem.  It is diagnosed by having your blood pressure taken lying down, sitting, and then standing.  It may be treated by changing your diet or adjusting your  medicines. This information is not intended to replace advice given to you by your health care provider. Make sure you discuss any questions you have with your health care provider. Document Revised: 02/01/2018 Document Reviewed: 02/01/2018 Elsevier Patient Education  [place].

## 2020-05-19 DIAGNOSIS — E113593 Type 2 diabetes mellitus with proliferative diabetic retinopathy without macular edema, bilateral: Secondary | ICD-10-CM | POA: Diagnosis not present

## 2020-05-19 DIAGNOSIS — E113591 Type 2 diabetes mellitus with proliferative diabetic retinopathy without macular edema, right eye: Secondary | ICD-10-CM | POA: Diagnosis not present

## 2020-05-19 DIAGNOSIS — E113592 Type 2 diabetes mellitus with proliferative diabetic retinopathy without macular edema, left eye: Secondary | ICD-10-CM | POA: Diagnosis not present

## 2020-05-21 DIAGNOSIS — L239 Allergic contact dermatitis, unspecified cause: Secondary | ICD-10-CM | POA: Diagnosis not present

## 2020-05-26 DIAGNOSIS — E1165 Type 2 diabetes mellitus with hyperglycemia: Secondary | ICD-10-CM | POA: Diagnosis not present

## 2020-05-26 DIAGNOSIS — R42 Dizziness and giddiness: Secondary | ICD-10-CM | POA: Diagnosis not present

## 2020-06-11 DIAGNOSIS — J019 Acute sinusitis, unspecified: Secondary | ICD-10-CM | POA: Diagnosis not present

## 2020-08-11 DIAGNOSIS — Z8639 Personal history of other endocrine, nutritional and metabolic disease: Secondary | ICD-10-CM | POA: Diagnosis not present

## 2020-08-11 DIAGNOSIS — E11319 Type 2 diabetes mellitus with unspecified diabetic retinopathy without macular edema: Secondary | ICD-10-CM | POA: Diagnosis not present

## 2020-08-11 DIAGNOSIS — Z794 Long term (current) use of insulin: Secondary | ICD-10-CM | POA: Diagnosis not present

## 2020-08-11 DIAGNOSIS — E669 Obesity, unspecified: Secondary | ICD-10-CM | POA: Diagnosis not present

## 2020-10-12 DIAGNOSIS — R197 Diarrhea, unspecified: Secondary | ICD-10-CM | POA: Diagnosis not present

## 2020-10-12 DIAGNOSIS — K5792 Diverticulitis of intestine, part unspecified, without perforation or abscess without bleeding: Secondary | ICD-10-CM | POA: Diagnosis not present

## 2020-11-17 DIAGNOSIS — E113592 Type 2 diabetes mellitus with proliferative diabetic retinopathy without macular edema, left eye: Secondary | ICD-10-CM | POA: Diagnosis not present

## 2020-11-17 DIAGNOSIS — E113591 Type 2 diabetes mellitus with proliferative diabetic retinopathy without macular edema, right eye: Secondary | ICD-10-CM | POA: Diagnosis not present

## 2020-11-24 DIAGNOSIS — E113591 Type 2 diabetes mellitus with proliferative diabetic retinopathy without macular edema, right eye: Secondary | ICD-10-CM | POA: Diagnosis not present

## 2020-11-24 DIAGNOSIS — E113592 Type 2 diabetes mellitus with proliferative diabetic retinopathy without macular edema, left eye: Secondary | ICD-10-CM | POA: Diagnosis not present

## 2020-11-27 ENCOUNTER — Encounter: Payer: Federal, State, Local not specified - PPO | Admitting: Podiatry

## 2020-11-27 ENCOUNTER — Other Ambulatory Visit: Payer: Self-pay

## 2020-11-27 ENCOUNTER — Ambulatory Visit: Payer: Federal, State, Local not specified - PPO | Admitting: Podiatry

## 2020-11-27 ENCOUNTER — Encounter: Payer: Self-pay | Admitting: Podiatry

## 2020-11-27 DIAGNOSIS — L84 Corns and callosities: Secondary | ICD-10-CM | POA: Diagnosis not present

## 2020-11-27 DIAGNOSIS — L309 Dermatitis, unspecified: Secondary | ICD-10-CM

## 2020-11-27 DIAGNOSIS — E0843 Diabetes mellitus due to underlying condition with diabetic autonomic (poly)neuropathy: Secondary | ICD-10-CM

## 2020-11-27 DIAGNOSIS — S90221A Contusion of right lesser toe(s) with damage to nail, initial encounter: Secondary | ICD-10-CM | POA: Diagnosis not present

## 2020-11-27 MED ORDER — CLOTRIMAZOLE-BETAMETHASONE 1-0.05 % EX CREA: 1.0000 "application " | TOPICAL_CREAM | Freq: Two times a day (BID) | CUTANEOUS | 2 refills | Status: AC

## 2020-11-27 NOTE — Progress Notes (Signed)
HPI: 50 y.o. male presenting today as a referral from his PCP for routine diabetic foot exam.  Patient has no pain associated to his foot.  He does state for the past year he has had some dermatitis to the bilateral feet and legs.  He has been seen by dermatologist in the past and prescribed steroid creams.  He is also tried mupirocin ointment.  There has not been any lasting improvement of the skin lesions.  Patient states that his last hemoglobin A1c was 9.0.  He is working with his PCP to better control his blood glucose levels.  He also states that he sits throughout the majority of the day for work and he developed swelling in his legs.  He presents for further treatment evaluation  Past Medical History:  Diagnosis Date  . Adhesive capsulitis of left shoulder    frozen shoulder  . Allergic rhinitis   . Anxiety   . Depressed mood   . Depression   . Diabetes mellitus without complication ([place])   . ED (erectile dysfunction) 01/2015  . Eosinophilic esophagitis   . Esophageal dysphagia   . Family history of colon cancer   . GERD (gastroesophageal reflux disease)   . H/O adenomatous polyp of colon   . Hypertension   . Long term (current) use of insulin ([place])   . Neck pain   . Obesity   . Pure hypercholesterolemia   . Shortness of breath dyspnea    with exercise  . Sinusitis    unspecified  . Type 2 diabetes mellitus with diabetic polyneuropathy, with long-term current use of insulin ([place])    unspecified  . 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  Dermatology: Skin is warm, dry and supple bilateral lower extremities. Negative for open lesions or macerations.  There are 2 distinct erythematous lesions to the dorsum of the bilateral foot approximately half dollar size and circular.  Erythematous border.  There is some very superficial  skin breakdown noted as well to the lesions. Dry stable subungual hematomas also noted to the bilateral great toes and second digits.  Very stable and nonpainful  Vascular: Palpable pedal pulses bilaterally. No edema or erythema noted. Capillary refill within normal limits.  Neurological: Epicritic and protective threshold grossly intact bilaterally.   Musculoskeletal Exam: No significant pedal deformities noted   Assessment: 1.  Diabetes mellitus with hyperglycemia 2.  Subungual hematomas bilateral nail plates 3.  Dermatitis bilateral feet of unknown etiology   Plan of Care:  1. Patient evaluated.  Comprehensive diabetic foot exam performed. 2.  Prescription for Lotrisone cream apply 2 times daily for 1 month to see if this helps resolve his lesions to the bilateral feet 3.  Continue wearing good supportive shoes 4.  Recommend knee-high compression socks daily 5.  Continue close management with PCP to better control blood glucose levels 6.  Return to clinic in 4 weeks for dermatitis follow-up  *Sits on the computer for work all day.      Edrick Kins, DPM [facility]  Dr. Edrick Kins, DPM    [address]  [place], [place] [address]                Office [phone]  Fax [phone]

## 2020-12-07 NOTE — Progress Notes (Signed)
This encounter was created in error - please disregard.

## 2021-01-01 ENCOUNTER — Ambulatory Visit: Payer: Federal, State, Local not specified - PPO | Admitting: Podiatry

## 2021-01-05 ENCOUNTER — Ambulatory Visit: Payer: Federal, State, Local not specified - PPO | Admitting: Podiatry

## 2021-02-25 DIAGNOSIS — E113593 Type 2 diabetes mellitus with proliferative diabetic retinopathy without macular edema, bilateral: Secondary | ICD-10-CM | POA: Diagnosis not present

## 2021-03-01 DIAGNOSIS — Z8639 Personal history of other endocrine, nutritional and metabolic disease: Secondary | ICD-10-CM | POA: Diagnosis not present

## 2021-03-01 DIAGNOSIS — E669 Obesity, unspecified: Secondary | ICD-10-CM | POA: Diagnosis not present

## 2021-03-01 DIAGNOSIS — Z794 Long term (current) use of insulin: Secondary | ICD-10-CM | POA: Diagnosis not present

## 2021-03-01 DIAGNOSIS — E113593 Type 2 diabetes mellitus with proliferative diabetic retinopathy without macular edema, bilateral: Secondary | ICD-10-CM | POA: Diagnosis not present

## 2021-03-02 DIAGNOSIS — E7800 Pure hypercholesterolemia, unspecified: Secondary | ICD-10-CM | POA: Diagnosis not present

## 2021-03-02 DIAGNOSIS — Z0000 Encounter for general adult medical examination without abnormal findings: Secondary | ICD-10-CM | POA: Diagnosis not present

## 2021-03-02 DIAGNOSIS — Z125 Encounter for screening for malignant neoplasm of prostate: Secondary | ICD-10-CM | POA: Diagnosis not present

## 2021-03-02 DIAGNOSIS — E1165 Type 2 diabetes mellitus with hyperglycemia: Secondary | ICD-10-CM | POA: Diagnosis not present

## 2021-03-02 DIAGNOSIS — I10 Essential (primary) hypertension: Secondary | ICD-10-CM | POA: Diagnosis not present

## 2021-04-08 DIAGNOSIS — E113592 Type 2 diabetes mellitus with proliferative diabetic retinopathy without macular edema, left eye: Secondary | ICD-10-CM | POA: Diagnosis not present

## 2021-04-08 DIAGNOSIS — E113593 Type 2 diabetes mellitus with proliferative diabetic retinopathy without macular edema, bilateral: Secondary | ICD-10-CM | POA: Diagnosis not present

## 2021-04-15 DIAGNOSIS — R1319 Other dysphagia: Secondary | ICD-10-CM | POA: Diagnosis not present

## 2021-04-15 DIAGNOSIS — K200 Eosinophilic esophagitis: Secondary | ICD-10-CM | POA: Diagnosis not present

## 2021-07-22 DIAGNOSIS — D122 Benign neoplasm of ascending colon: Secondary | ICD-10-CM | POA: Diagnosis not present

## 2021-07-22 DIAGNOSIS — K648 Other hemorrhoids: Secondary | ICD-10-CM | POA: Diagnosis not present

## 2021-07-22 DIAGNOSIS — Z86010 Personal history of colonic polyps: Secondary | ICD-10-CM | POA: Diagnosis not present

## 2021-07-22 DIAGNOSIS — D125 Benign neoplasm of sigmoid colon: Secondary | ICD-10-CM | POA: Diagnosis not present

## 2021-07-22 DIAGNOSIS — K200 Eosinophilic esophagitis: Secondary | ICD-10-CM | POA: Diagnosis not present

## 2021-07-22 DIAGNOSIS — K2970 Gastritis, unspecified, without bleeding: Secondary | ICD-10-CM | POA: Diagnosis not present

## 2021-07-22 DIAGNOSIS — Z800 Family history of malignant neoplasm of digestive organs: Secondary | ICD-10-CM | POA: Diagnosis not present

## 2021-07-22 DIAGNOSIS — R1013 Epigastric pain: Secondary | ICD-10-CM | POA: Diagnosis not present

## 2021-07-22 DIAGNOSIS — K2930 Chronic superficial gastritis without bleeding: Secondary | ICD-10-CM | POA: Diagnosis not present

## 2021-09-09 DIAGNOSIS — E1142 Type 2 diabetes mellitus with diabetic polyneuropathy: Secondary | ICD-10-CM | POA: Diagnosis not present

## 2021-09-09 DIAGNOSIS — E1165 Type 2 diabetes mellitus with hyperglycemia: Secondary | ICD-10-CM | POA: Diagnosis not present

## 2021-09-09 DIAGNOSIS — Z794 Long term (current) use of insulin: Secondary | ICD-10-CM | POA: Diagnosis not present

## 2021-09-09 DIAGNOSIS — F331 Major depressive disorder, recurrent, moderate: Secondary | ICD-10-CM | POA: Diagnosis not present

## 2021-09-14 DIAGNOSIS — I10 Essential (primary) hypertension: Secondary | ICD-10-CM | POA: Diagnosis not present

## 2021-09-14 DIAGNOSIS — F419 Anxiety disorder, unspecified: Secondary | ICD-10-CM | POA: Diagnosis not present

## 2021-09-14 DIAGNOSIS — R519 Headache, unspecified: Secondary | ICD-10-CM | POA: Diagnosis not present

## 2021-09-14 DIAGNOSIS — M542 Cervicalgia: Secondary | ICD-10-CM | POA: Diagnosis not present

## 2021-09-23 DIAGNOSIS — E113522 Type 2 diabetes mellitus with proliferative diabetic retinopathy with traction retinal detachment involving the macula, left eye: Secondary | ICD-10-CM | POA: Diagnosis not present

## 2021-09-23 DIAGNOSIS — H35373 Puckering of macula, bilateral: Secondary | ICD-10-CM | POA: Diagnosis not present

## 2021-09-23 DIAGNOSIS — E113591 Type 2 diabetes mellitus with proliferative diabetic retinopathy without macular edema, right eye: Secondary | ICD-10-CM | POA: Diagnosis not present

## 2021-09-23 DIAGNOSIS — E113592 Type 2 diabetes mellitus with proliferative diabetic retinopathy without macular edema, left eye: Secondary | ICD-10-CM | POA: Diagnosis not present

## 2021-09-23 DIAGNOSIS — H4313 Vitreous hemorrhage, bilateral: Secondary | ICD-10-CM | POA: Diagnosis not present

## 2021-09-27 DIAGNOSIS — E119 Type 2 diabetes mellitus without complications: Secondary | ICD-10-CM | POA: Diagnosis not present

## 2021-09-27 DIAGNOSIS — E113532 Type 2 diabetes mellitus with proliferative diabetic retinopathy with traction retinal detachment not involving the macula, left eye: Secondary | ICD-10-CM | POA: Diagnosis not present

## 2021-09-27 DIAGNOSIS — H269 Unspecified cataract: Secondary | ICD-10-CM | POA: Diagnosis not present

## 2021-09-27 DIAGNOSIS — E113522 Type 2 diabetes mellitus with proliferative diabetic retinopathy with traction retinal detachment involving the macula, left eye: Secondary | ICD-10-CM | POA: Diagnosis not present

## 2021-09-27 DIAGNOSIS — H3342 Traction detachment of retina, left eye: Secondary | ICD-10-CM | POA: Diagnosis not present

## 2021-10-25 DIAGNOSIS — H9011 Conductive hearing loss, unilateral, right ear, with unrestricted hearing on the contralateral side: Secondary | ICD-10-CM | POA: Diagnosis not present

## 2021-11-05 DIAGNOSIS — Z794 Long term (current) use of insulin: Secondary | ICD-10-CM | POA: Diagnosis not present

## 2021-11-05 DIAGNOSIS — E669 Obesity, unspecified: Secondary | ICD-10-CM | POA: Diagnosis not present

## 2021-11-05 DIAGNOSIS — E113593 Type 2 diabetes mellitus with proliferative diabetic retinopathy without macular edema, bilateral: Secondary | ICD-10-CM | POA: Diagnosis not present

## 2021-11-05 DIAGNOSIS — Z8639 Personal history of other endocrine, nutritional and metabolic disease: Secondary | ICD-10-CM | POA: Diagnosis not present

## 2022-01-04 DIAGNOSIS — H4313 Vitreous hemorrhage, bilateral: Secondary | ICD-10-CM | POA: Diagnosis not present

## 2022-01-04 DIAGNOSIS — E113522 Type 2 diabetes mellitus with proliferative diabetic retinopathy with traction retinal detachment involving the macula, left eye: Secondary | ICD-10-CM | POA: Diagnosis not present

## 2022-01-04 DIAGNOSIS — H35373 Puckering of macula, bilateral: Secondary | ICD-10-CM | POA: Diagnosis not present

## 2022-01-04 DIAGNOSIS — E113591 Type 2 diabetes mellitus with proliferative diabetic retinopathy without macular edema, right eye: Secondary | ICD-10-CM | POA: Diagnosis not present

## 2022-02-01 DIAGNOSIS — H35373 Puckering of macula, bilateral: Secondary | ICD-10-CM | POA: Diagnosis not present

## 2022-02-01 DIAGNOSIS — E113522 Type 2 diabetes mellitus with proliferative diabetic retinopathy with traction retinal detachment involving the macula, left eye: Secondary | ICD-10-CM | POA: Diagnosis not present

## 2022-02-01 DIAGNOSIS — H4313 Vitreous hemorrhage, bilateral: Secondary | ICD-10-CM | POA: Diagnosis not present

## 2022-02-01 DIAGNOSIS — E113591 Type 2 diabetes mellitus with proliferative diabetic retinopathy without macular edema, right eye: Secondary | ICD-10-CM | POA: Diagnosis not present

## 2022-02-07 DIAGNOSIS — Z8639 Personal history of other endocrine, nutritional and metabolic disease: Secondary | ICD-10-CM | POA: Diagnosis not present

## 2022-02-07 DIAGNOSIS — E119 Type 2 diabetes mellitus without complications: Secondary | ICD-10-CM | POA: Diagnosis not present

## 2022-02-07 DIAGNOSIS — E669 Obesity, unspecified: Secondary | ICD-10-CM | POA: Diagnosis not present

## 2022-02-07 DIAGNOSIS — Z794 Long term (current) use of insulin: Secondary | ICD-10-CM | POA: Diagnosis not present

## 2022-02-07 DIAGNOSIS — E113593 Type 2 diabetes mellitus with proliferative diabetic retinopathy without macular edema, bilateral: Secondary | ICD-10-CM | POA: Diagnosis not present

## 2022-03-10 DIAGNOSIS — M9901 Segmental and somatic dysfunction of cervical region: Secondary | ICD-10-CM | POA: Diagnosis not present

## 2022-03-10 DIAGNOSIS — M9902 Segmental and somatic dysfunction of thoracic region: Secondary | ICD-10-CM | POA: Diagnosis not present

## 2022-03-10 DIAGNOSIS — M9903 Segmental and somatic dysfunction of lumbar region: Secondary | ICD-10-CM | POA: Diagnosis not present

## 2022-03-10 DIAGNOSIS — M542 Cervicalgia: Secondary | ICD-10-CM | POA: Diagnosis not present

## 2022-03-15 DIAGNOSIS — M542 Cervicalgia: Secondary | ICD-10-CM | POA: Diagnosis not present

## 2022-03-15 DIAGNOSIS — M9903 Segmental and somatic dysfunction of lumbar region: Secondary | ICD-10-CM | POA: Diagnosis not present

## 2022-03-15 DIAGNOSIS — M9901 Segmental and somatic dysfunction of cervical region: Secondary | ICD-10-CM | POA: Diagnosis not present

## 2022-03-15 DIAGNOSIS — M9902 Segmental and somatic dysfunction of thoracic region: Secondary | ICD-10-CM | POA: Diagnosis not present

## 2022-03-16 DIAGNOSIS — M542 Cervicalgia: Secondary | ICD-10-CM | POA: Diagnosis not present

## 2022-03-16 DIAGNOSIS — M9901 Segmental and somatic dysfunction of cervical region: Secondary | ICD-10-CM | POA: Diagnosis not present

## 2022-03-16 DIAGNOSIS — M9903 Segmental and somatic dysfunction of lumbar region: Secondary | ICD-10-CM | POA: Diagnosis not present

## 2022-03-16 DIAGNOSIS — M9902 Segmental and somatic dysfunction of thoracic region: Secondary | ICD-10-CM | POA: Diagnosis not present

## 2022-03-18 DIAGNOSIS — M542 Cervicalgia: Secondary | ICD-10-CM | POA: Diagnosis not present

## 2022-03-18 DIAGNOSIS — M9901 Segmental and somatic dysfunction of cervical region: Secondary | ICD-10-CM | POA: Diagnosis not present

## 2022-03-18 DIAGNOSIS — M9902 Segmental and somatic dysfunction of thoracic region: Secondary | ICD-10-CM | POA: Diagnosis not present

## 2022-03-18 DIAGNOSIS — M9903 Segmental and somatic dysfunction of lumbar region: Secondary | ICD-10-CM | POA: Diagnosis not present

## 2022-03-21 DIAGNOSIS — M9901 Segmental and somatic dysfunction of cervical region: Secondary | ICD-10-CM | POA: Diagnosis not present

## 2022-03-21 DIAGNOSIS — M542 Cervicalgia: Secondary | ICD-10-CM | POA: Diagnosis not present

## 2022-03-21 DIAGNOSIS — M9903 Segmental and somatic dysfunction of lumbar region: Secondary | ICD-10-CM | POA: Diagnosis not present

## 2022-03-21 DIAGNOSIS — M9902 Segmental and somatic dysfunction of thoracic region: Secondary | ICD-10-CM | POA: Diagnosis not present

## 2022-03-23 DIAGNOSIS — M542 Cervicalgia: Secondary | ICD-10-CM | POA: Diagnosis not present

## 2022-03-23 DIAGNOSIS — M9903 Segmental and somatic dysfunction of lumbar region: Secondary | ICD-10-CM | POA: Diagnosis not present

## 2022-03-23 DIAGNOSIS — M9901 Segmental and somatic dysfunction of cervical region: Secondary | ICD-10-CM | POA: Diagnosis not present

## 2022-03-23 DIAGNOSIS — M9902 Segmental and somatic dysfunction of thoracic region: Secondary | ICD-10-CM | POA: Diagnosis not present

## 2022-03-24 DIAGNOSIS — E113522 Type 2 diabetes mellitus with proliferative diabetic retinopathy with traction retinal detachment involving the macula, left eye: Secondary | ICD-10-CM | POA: Diagnosis not present

## 2022-03-24 DIAGNOSIS — H4313 Vitreous hemorrhage, bilateral: Secondary | ICD-10-CM | POA: Diagnosis not present

## 2022-03-24 DIAGNOSIS — E113591 Type 2 diabetes mellitus with proliferative diabetic retinopathy without macular edema, right eye: Secondary | ICD-10-CM | POA: Diagnosis not present

## 2022-03-24 DIAGNOSIS — H35373 Puckering of macula, bilateral: Secondary | ICD-10-CM | POA: Diagnosis not present

## 2022-03-25 DIAGNOSIS — M9902 Segmental and somatic dysfunction of thoracic region: Secondary | ICD-10-CM | POA: Diagnosis not present

## 2022-03-25 DIAGNOSIS — M9903 Segmental and somatic dysfunction of lumbar region: Secondary | ICD-10-CM | POA: Diagnosis not present

## 2022-03-25 DIAGNOSIS — M542 Cervicalgia: Secondary | ICD-10-CM | POA: Diagnosis not present

## 2022-03-25 DIAGNOSIS — M9901 Segmental and somatic dysfunction of cervical region: Secondary | ICD-10-CM | POA: Diagnosis not present

## 2022-03-30 DIAGNOSIS — M9903 Segmental and somatic dysfunction of lumbar region: Secondary | ICD-10-CM | POA: Diagnosis not present

## 2022-03-30 DIAGNOSIS — M9901 Segmental and somatic dysfunction of cervical region: Secondary | ICD-10-CM | POA: Diagnosis not present

## 2022-03-30 DIAGNOSIS — M542 Cervicalgia: Secondary | ICD-10-CM | POA: Diagnosis not present

## 2022-03-30 DIAGNOSIS — M9902 Segmental and somatic dysfunction of thoracic region: Secondary | ICD-10-CM | POA: Diagnosis not present

## 2022-04-06 DIAGNOSIS — M9901 Segmental and somatic dysfunction of cervical region: Secondary | ICD-10-CM | POA: Diagnosis not present

## 2022-04-06 DIAGNOSIS — M542 Cervicalgia: Secondary | ICD-10-CM | POA: Diagnosis not present

## 2022-04-06 DIAGNOSIS — M9902 Segmental and somatic dysfunction of thoracic region: Secondary | ICD-10-CM | POA: Diagnosis not present

## 2022-04-06 DIAGNOSIS — M9903 Segmental and somatic dysfunction of lumbar region: Secondary | ICD-10-CM | POA: Diagnosis not present

## 2022-04-08 DIAGNOSIS — M9902 Segmental and somatic dysfunction of thoracic region: Secondary | ICD-10-CM | POA: Diagnosis not present

## 2022-04-08 DIAGNOSIS — M9903 Segmental and somatic dysfunction of lumbar region: Secondary | ICD-10-CM | POA: Diagnosis not present

## 2022-04-08 DIAGNOSIS — M542 Cervicalgia: Secondary | ICD-10-CM | POA: Diagnosis not present

## 2022-04-08 DIAGNOSIS — M9901 Segmental and somatic dysfunction of cervical region: Secondary | ICD-10-CM | POA: Diagnosis not present

## 2022-04-20 DIAGNOSIS — M542 Cervicalgia: Secondary | ICD-10-CM | POA: Diagnosis not present

## 2022-04-20 DIAGNOSIS — M9902 Segmental and somatic dysfunction of thoracic region: Secondary | ICD-10-CM | POA: Diagnosis not present

## 2022-04-20 DIAGNOSIS — M9903 Segmental and somatic dysfunction of lumbar region: Secondary | ICD-10-CM | POA: Diagnosis not present

## 2022-04-20 DIAGNOSIS — M9901 Segmental and somatic dysfunction of cervical region: Secondary | ICD-10-CM | POA: Diagnosis not present

## 2022-04-22 DIAGNOSIS — M9901 Segmental and somatic dysfunction of cervical region: Secondary | ICD-10-CM | POA: Diagnosis not present

## 2022-04-22 DIAGNOSIS — M542 Cervicalgia: Secondary | ICD-10-CM | POA: Diagnosis not present

## 2022-04-22 DIAGNOSIS — M9903 Segmental and somatic dysfunction of lumbar region: Secondary | ICD-10-CM | POA: Diagnosis not present

## 2022-04-22 DIAGNOSIS — M9902 Segmental and somatic dysfunction of thoracic region: Secondary | ICD-10-CM | POA: Diagnosis not present

## 2022-05-03 DIAGNOSIS — E113531 Type 2 diabetes mellitus with proliferative diabetic retinopathy with traction retinal detachment not involving the macula, right eye: Secondary | ICD-10-CM | POA: Diagnosis not present

## 2022-05-03 DIAGNOSIS — H4311 Vitreous hemorrhage, right eye: Secondary | ICD-10-CM | POA: Diagnosis not present

## 2022-05-05 DIAGNOSIS — E113531 Type 2 diabetes mellitus with proliferative diabetic retinopathy with traction retinal detachment not involving the macula, right eye: Secondary | ICD-10-CM | POA: Diagnosis not present

## 2022-05-09 DIAGNOSIS — H269 Unspecified cataract: Secondary | ICD-10-CM | POA: Diagnosis not present

## 2022-05-09 DIAGNOSIS — Z7984 Long term (current) use of oral hypoglycemic drugs: Secondary | ICD-10-CM | POA: Diagnosis not present

## 2022-05-09 DIAGNOSIS — H3321 Serous retinal detachment, right eye: Secondary | ICD-10-CM | POA: Diagnosis not present

## 2022-05-09 DIAGNOSIS — H4311 Vitreous hemorrhage, right eye: Secondary | ICD-10-CM | POA: Diagnosis not present

## 2022-05-09 DIAGNOSIS — H35371 Puckering of macula, right eye: Secondary | ICD-10-CM | POA: Diagnosis not present

## 2022-05-09 DIAGNOSIS — T7840XA Allergy, unspecified, initial encounter: Secondary | ICD-10-CM | POA: Diagnosis not present

## 2022-05-09 DIAGNOSIS — E113531 Type 2 diabetes mellitus with proliferative diabetic retinopathy with traction retinal detachment not involving the macula, right eye: Secondary | ICD-10-CM | POA: Diagnosis not present

## 2022-05-09 DIAGNOSIS — E119 Type 2 diabetes mellitus without complications: Secondary | ICD-10-CM | POA: Diagnosis not present

## 2022-05-09 DIAGNOSIS — E113521 Type 2 diabetes mellitus with proliferative diabetic retinopathy with traction retinal detachment involving the macula, right eye: Secondary | ICD-10-CM | POA: Diagnosis not present

## 2022-06-29 DIAGNOSIS — Z8639 Personal history of other endocrine, nutritional and metabolic disease: Secondary | ICD-10-CM | POA: Diagnosis not present

## 2022-06-29 DIAGNOSIS — E113593 Type 2 diabetes mellitus with proliferative diabetic retinopathy without macular edema, bilateral: Secondary | ICD-10-CM | POA: Diagnosis not present

## 2022-06-29 DIAGNOSIS — E669 Obesity, unspecified: Secondary | ICD-10-CM | POA: Diagnosis not present

## 2022-06-29 DIAGNOSIS — Z794 Long term (current) use of insulin: Secondary | ICD-10-CM | POA: Diagnosis not present

## 2022-09-13 DIAGNOSIS — H35371 Puckering of macula, right eye: Secondary | ICD-10-CM | POA: Diagnosis not present

## 2022-09-13 DIAGNOSIS — H4311 Vitreous hemorrhage, right eye: Secondary | ICD-10-CM | POA: Diagnosis not present

## 2022-09-13 DIAGNOSIS — E113522 Type 2 diabetes mellitus with proliferative diabetic retinopathy with traction retinal detachment involving the macula, left eye: Secondary | ICD-10-CM | POA: Diagnosis not present

## 2022-09-13 DIAGNOSIS — E113531 Type 2 diabetes mellitus with proliferative diabetic retinopathy with traction retinal detachment not involving the macula, right eye: Secondary | ICD-10-CM | POA: Diagnosis not present

## 2023-01-08 DIAGNOSIS — M7989 Other specified soft tissue disorders: Secondary | ICD-10-CM | POA: Diagnosis not present

## 2023-01-08 DIAGNOSIS — W450XXA Nail entering through skin, initial encounter: Secondary | ICD-10-CM | POA: Diagnosis not present

## 2023-01-08 DIAGNOSIS — Z23 Encounter for immunization: Secondary | ICD-10-CM | POA: Diagnosis not present

## 2023-01-08 DIAGNOSIS — S91331A Puncture wound without foreign body, right foot, initial encounter: Secondary | ICD-10-CM | POA: Diagnosis not present

## 2023-01-08 DIAGNOSIS — Z888 Allergy status to other drugs, medicaments and biological substances status: Secondary | ICD-10-CM | POA: Diagnosis not present

## 2023-01-08 DIAGNOSIS — Z91040 Latex allergy status: Secondary | ICD-10-CM | POA: Diagnosis not present

## 2023-01-08 DIAGNOSIS — E119 Type 2 diabetes mellitus without complications: Secondary | ICD-10-CM | POA: Diagnosis not present

## 2023-01-08 DIAGNOSIS — Z880 Allergy status to penicillin: Secondary | ICD-10-CM | POA: Diagnosis not present

## 2023-01-30 DIAGNOSIS — L989 Disorder of the skin and subcutaneous tissue, unspecified: Secondary | ICD-10-CM | POA: Diagnosis not present

## 2023-01-30 DIAGNOSIS — M79673 Pain in unspecified foot: Secondary | ICD-10-CM | POA: Diagnosis not present

## 2023-01-30 DIAGNOSIS — R519 Headache, unspecified: Secondary | ICD-10-CM | POA: Diagnosis not present

## 2023-03-02 DIAGNOSIS — E113593 Type 2 diabetes mellitus with proliferative diabetic retinopathy without macular edema, bilateral: Secondary | ICD-10-CM | POA: Diagnosis not present

## 2023-03-02 DIAGNOSIS — E669 Obesity, unspecified: Secondary | ICD-10-CM | POA: Diagnosis not present

## 2023-03-02 DIAGNOSIS — Z8639 Personal history of other endocrine, nutritional and metabolic disease: Secondary | ICD-10-CM | POA: Diagnosis not present

## 2023-03-02 DIAGNOSIS — Z794 Long term (current) use of insulin: Secondary | ICD-10-CM | POA: Diagnosis not present

## 2023-03-10 ENCOUNTER — Ambulatory Visit (INDEPENDENT_AMBULATORY_CARE_PROVIDER_SITE_OTHER): Payer: Federal, State, Local not specified - PPO | Admitting: Podiatry

## 2023-03-10 DIAGNOSIS — E119 Type 2 diabetes mellitus without complications: Secondary | ICD-10-CM | POA: Diagnosis not present

## 2023-03-10 DIAGNOSIS — E1142 Type 2 diabetes mellitus with diabetic polyneuropathy: Secondary | ICD-10-CM

## 2023-03-10 NOTE — Progress Notes (Signed)
  Subjective:  Patient ID: Joshua Mendez, male    DOB: [birth year],  MRN: [id number]  Joshua Complaint  Patient presents with   foot exam    Diabetic Foot Exam- patient stepped stepped on a screw about a month ago. Patient is wanting to examine his feet. He trims his own toenails.     52 y.o. male presents with the above complaint. History confirmed with patient. Patient presenting for routine foot check. States he stepped on a  screw about a month ago. Was seen in ED and given Abx. He states it has healed well no drainage or swelling. He reports some neuropathy. Does have a hsitory of DM 2.   Objective:  Physical Exam: warm, good capillary refill nail exam normal nails without lesions DP pulses palpable, PT pulses palpable, and protective sensation intact Left Foot:  No pain or open wounds Right Foot: No pain or open wounds. Tiny callus / eschar at site of screw entry.  Assessment:   1. Encounter for diabetic foot exam (HCC)   2. DM type 2 with diabetic peripheral neuropathy (HCC)      Plan:  Patient was evaluated and treated and all questions answered.  #DM 2 with neuropathy - No evidence of residual infection or concern in regards to prior right foot puncture wound, appears fully healed at this time - Continue to monitor for changes Patient educated on diabetes. Discussed proper diabetic foot care and discussed risks and complications of disease. Educated patient in depth on reasons to return to the office immediately should he/she discover anything concerning or new on the feet. All questions answered. Discussed proper shoes as well.   Return in about 1 year (around 03/09/2024) for Annual DM exam.         Corinna Gab, DPM [facility] / [facility]

## 2023-03-14 DIAGNOSIS — H35371 Puckering of macula, right eye: Secondary | ICD-10-CM | POA: Diagnosis not present

## 2023-03-14 DIAGNOSIS — H4311 Vitreous hemorrhage, right eye: Secondary | ICD-10-CM | POA: Diagnosis not present

## 2023-03-14 DIAGNOSIS — E113531 Type 2 diabetes mellitus with proliferative diabetic retinopathy with traction retinal detachment not involving the macula, right eye: Secondary | ICD-10-CM | POA: Diagnosis not present

## 2023-03-14 DIAGNOSIS — E113522 Type 2 diabetes mellitus with proliferative diabetic retinopathy with traction retinal detachment involving the macula, left eye: Secondary | ICD-10-CM | POA: Diagnosis not present

## 2023-03-17 ENCOUNTER — Ambulatory Visit: Payer: Federal, State, Local not specified - PPO | Admitting: Podiatry

## 2023-03-29 DIAGNOSIS — E7800 Pure hypercholesterolemia, unspecified: Secondary | ICD-10-CM | POA: Diagnosis not present

## 2023-03-29 DIAGNOSIS — E113593 Type 2 diabetes mellitus with proliferative diabetic retinopathy without macular edema, bilateral: Secondary | ICD-10-CM | POA: Diagnosis not present

## 2023-03-29 DIAGNOSIS — Z125 Encounter for screening for malignant neoplasm of prostate: Secondary | ICD-10-CM | POA: Diagnosis not present

## 2023-03-29 DIAGNOSIS — Z0000 Encounter for general adult medical examination without abnormal findings: Secondary | ICD-10-CM | POA: Diagnosis not present

## 2023-03-29 DIAGNOSIS — I10 Essential (primary) hypertension: Secondary | ICD-10-CM | POA: Diagnosis not present

## 2023-04-28 DIAGNOSIS — H2513 Age-related nuclear cataract, bilateral: Secondary | ICD-10-CM | POA: Diagnosis not present

## 2023-04-28 DIAGNOSIS — Z794 Long term (current) use of insulin: Secondary | ICD-10-CM | POA: Diagnosis not present

## 2023-04-28 DIAGNOSIS — I10 Essential (primary) hypertension: Secondary | ICD-10-CM | POA: Diagnosis not present

## 2023-04-28 DIAGNOSIS — H2511 Age-related nuclear cataract, right eye: Secondary | ICD-10-CM | POA: Diagnosis not present

## 2023-04-28 DIAGNOSIS — E113553 Type 2 diabetes mellitus with stable proliferative diabetic retinopathy, bilateral: Secondary | ICD-10-CM | POA: Diagnosis not present

## 2023-06-05 DIAGNOSIS — Z8639 Personal history of other endocrine, nutritional and metabolic disease: Secondary | ICD-10-CM | POA: Diagnosis not present

## 2023-06-05 DIAGNOSIS — Z23 Encounter for immunization: Secondary | ICD-10-CM | POA: Diagnosis not present

## 2023-06-05 DIAGNOSIS — E113593 Type 2 diabetes mellitus with proliferative diabetic retinopathy without macular edema, bilateral: Secondary | ICD-10-CM | POA: Diagnosis not present

## 2023-06-05 DIAGNOSIS — Z794 Long term (current) use of insulin: Secondary | ICD-10-CM | POA: Diagnosis not present

## 2023-06-05 DIAGNOSIS — E669 Obesity, unspecified: Secondary | ICD-10-CM | POA: Diagnosis not present

## 2023-06-23 DIAGNOSIS — Z794 Long term (current) use of insulin: Secondary | ICD-10-CM | POA: Diagnosis not present

## 2023-06-23 DIAGNOSIS — H25013 Cortical age-related cataract, bilateral: Secondary | ICD-10-CM | POA: Diagnosis not present

## 2023-06-23 DIAGNOSIS — H2513 Age-related nuclear cataract, bilateral: Secondary | ICD-10-CM | POA: Diagnosis not present

## 2023-06-23 DIAGNOSIS — E113553 Type 2 diabetes mellitus with stable proliferative diabetic retinopathy, bilateral: Secondary | ICD-10-CM | POA: Diagnosis not present

## 2023-08-03 DIAGNOSIS — H2511 Age-related nuclear cataract, right eye: Secondary | ICD-10-CM | POA: Diagnosis not present

## 2023-08-04 DIAGNOSIS — H25042 Posterior subcapsular polar age-related cataract, left eye: Secondary | ICD-10-CM | POA: Diagnosis not present

## 2023-08-04 DIAGNOSIS — H2512 Age-related nuclear cataract, left eye: Secondary | ICD-10-CM | POA: Diagnosis not present

## 2023-08-04 DIAGNOSIS — H25012 Cortical age-related cataract, left eye: Secondary | ICD-10-CM | POA: Diagnosis not present

## 2023-08-24 DIAGNOSIS — H25012 Cortical age-related cataract, left eye: Secondary | ICD-10-CM | POA: Diagnosis not present

## 2023-08-24 DIAGNOSIS — H2512 Age-related nuclear cataract, left eye: Secondary | ICD-10-CM | POA: Diagnosis not present

## 2023-09-05 DIAGNOSIS — Z794 Long term (current) use of insulin: Secondary | ICD-10-CM | POA: Diagnosis not present

## 2023-09-05 DIAGNOSIS — Z8639 Personal history of other endocrine, nutritional and metabolic disease: Secondary | ICD-10-CM | POA: Diagnosis not present

## 2023-09-05 DIAGNOSIS — E113593 Type 2 diabetes mellitus with proliferative diabetic retinopathy without macular edema, bilateral: Secondary | ICD-10-CM | POA: Diagnosis not present

## 2023-09-05 DIAGNOSIS — E669 Obesity, unspecified: Secondary | ICD-10-CM | POA: Diagnosis not present

## 2023-10-02 DIAGNOSIS — E1165 Type 2 diabetes mellitus with hyperglycemia: Secondary | ICD-10-CM | POA: Diagnosis not present

## 2023-10-02 DIAGNOSIS — F331 Major depressive disorder, recurrent, moderate: Secondary | ICD-10-CM | POA: Diagnosis not present

## 2023-10-02 DIAGNOSIS — Z794 Long term (current) use of insulin: Secondary | ICD-10-CM | POA: Diagnosis not present

## 2023-10-02 DIAGNOSIS — I10 Essential (primary) hypertension: Secondary | ICD-10-CM | POA: Diagnosis not present

## 2023-10-17 DIAGNOSIS — E113522 Type 2 diabetes mellitus with proliferative diabetic retinopathy with traction retinal detachment involving the macula, left eye: Secondary | ICD-10-CM | POA: Diagnosis not present

## 2023-10-17 DIAGNOSIS — H4311 Vitreous hemorrhage, right eye: Secondary | ICD-10-CM | POA: Diagnosis not present

## 2023-10-17 DIAGNOSIS — H35371 Puckering of macula, right eye: Secondary | ICD-10-CM | POA: Diagnosis not present

## 2023-10-17 DIAGNOSIS — E113531 Type 2 diabetes mellitus with proliferative diabetic retinopathy with traction retinal detachment not involving the macula, right eye: Secondary | ICD-10-CM | POA: Diagnosis not present

## 2023-12-04 DIAGNOSIS — E11319 Type 2 diabetes mellitus with unspecified diabetic retinopathy without macular edema: Secondary | ICD-10-CM | POA: Diagnosis not present

## 2023-12-04 DIAGNOSIS — E1165 Type 2 diabetes mellitus with hyperglycemia: Secondary | ICD-10-CM | POA: Diagnosis not present

## 2023-12-04 DIAGNOSIS — Z8639 Personal history of other endocrine, nutritional and metabolic disease: Secondary | ICD-10-CM | POA: Diagnosis not present

## 2023-12-04 DIAGNOSIS — E669 Obesity, unspecified: Secondary | ICD-10-CM | POA: Diagnosis not present

## 2023-12-04 DIAGNOSIS — K219 Gastro-esophageal reflux disease without esophagitis: Secondary | ICD-10-CM | POA: Diagnosis not present

## 2023-12-04 DIAGNOSIS — R197 Diarrhea, unspecified: Secondary | ICD-10-CM | POA: Diagnosis not present

## 2023-12-04 DIAGNOSIS — E113593 Type 2 diabetes mellitus with proliferative diabetic retinopathy without macular edema, bilateral: Secondary | ICD-10-CM | POA: Diagnosis not present

## 2023-12-04 DIAGNOSIS — F331 Major depressive disorder, recurrent, moderate: Secondary | ICD-10-CM | POA: Diagnosis not present

## 2023-12-04 DIAGNOSIS — Z794 Long term (current) use of insulin: Secondary | ICD-10-CM | POA: Diagnosis not present

## 2024-06-25 DIAGNOSIS — E113593 Type 2 diabetes mellitus with proliferative diabetic retinopathy without macular edema, bilateral: Secondary | ICD-10-CM | POA: Diagnosis not present

## 2024-06-25 DIAGNOSIS — E1165 Type 2 diabetes mellitus with hyperglycemia: Secondary | ICD-10-CM | POA: Diagnosis not present

## 2024-06-25 DIAGNOSIS — R609 Edema, unspecified: Secondary | ICD-10-CM | POA: Diagnosis not present

## 2024-06-25 DIAGNOSIS — Z87898 Personal history of other specified conditions: Secondary | ICD-10-CM | POA: Diagnosis not present

## 2024-06-25 DIAGNOSIS — Z8639 Personal history of other endocrine, nutritional and metabolic disease: Secondary | ICD-10-CM | POA: Diagnosis not present

## 2024-06-25 DIAGNOSIS — Z794 Long term (current) use of insulin: Secondary | ICD-10-CM | POA: Diagnosis not present

## 2024-06-25 DIAGNOSIS — E669 Obesity, unspecified: Secondary | ICD-10-CM | POA: Diagnosis not present

## 2024-06-25 DIAGNOSIS — R0602 Shortness of breath: Secondary | ICD-10-CM | POA: Diagnosis not present

## 2024-07-03 DIAGNOSIS — Z8639 Personal history of other endocrine, nutritional and metabolic disease: Secondary | ICD-10-CM | POA: Diagnosis not present

## 2024-07-03 DIAGNOSIS — E113593 Type 2 diabetes mellitus with proliferative diabetic retinopathy without macular edema, bilateral: Secondary | ICD-10-CM | POA: Diagnosis not present

## 2024-07-03 DIAGNOSIS — Z794 Long term (current) use of insulin: Secondary | ICD-10-CM | POA: Diagnosis not present

## 2024-07-03 DIAGNOSIS — E669 Obesity, unspecified: Secondary | ICD-10-CM | POA: Diagnosis not present

## 2024-07-23 ENCOUNTER — Encounter: Payer: Self-pay | Admitting: *Deleted

## 2024-07-24 ENCOUNTER — Ambulatory Visit: Attending: Internal Medicine | Admitting: Internal Medicine

## 2024-07-24 ENCOUNTER — Encounter: Payer: Self-pay | Admitting: Internal Medicine

## 2024-07-24 VITALS — BP 119/52 | HR 85 | Ht 73.0 in | Wt 241.8 lb

## 2024-07-24 DIAGNOSIS — R609 Edema, unspecified: Secondary | ICD-10-CM

## 2024-07-24 DIAGNOSIS — Z794 Long term (current) use of insulin: Secondary | ICD-10-CM | POA: Diagnosis not present

## 2024-07-24 DIAGNOSIS — R079 Chest pain, unspecified: Secondary | ICD-10-CM | POA: Diagnosis not present

## 2024-07-24 DIAGNOSIS — E113592 Type 2 diabetes mellitus with proliferative diabetic retinopathy without macular edema, left eye: Secondary | ICD-10-CM

## 2024-07-24 MED ORDER — METOPROLOL TARTRATE 50 MG PO TABS: 50.0000 mg | ORAL_TABLET | Freq: Once | ORAL | 0 refills | Status: AC

## 2024-07-24 NOTE — Patient Instructions (Addendum)
 Medication Instructions:  Metoprolol tartrate (lopressor) 50 mg 2 hours before your coronary CTA scan  Testing/Procedures: Your physician has requested that you have an echocardiogram. Echocardiography is a painless test that uses sound waves to create images of your heart. It provides your doctor with information about the size and shape of your heart and how well your heart's chambers and valves are working. This procedure takes approximately one hour. There are no restrictions for this procedure. Please do NOT wear cologne, perfume, aftershave, or lotions (deodorant is allowed). Please arrive 15 minutes prior to your appointment time.  Please note: We ask at that you not bring children with you during ultrasound (echo/ vascular) testing. Due to room size and safety concerns, children are not allowed in the ultrasound rooms during exams. Our front office staff cannot provide observation of children in our lobby area while testing is being conducted. An adult accompanying a patient to their appointment will only be allowed in the ultrasound room at the discretion of the ultrasound technician under special circumstances. We apologize for any inconvenience.   Non-Cardiac CT Angiography (CTA), is a special type of CT scan that uses a computer to produce multi-dimensional views of major blood vessels throughout the body. In CT angiography, a contrast material is injected through an IV to help visualize the blood vessels   Follow-Up: At [facility], you and your health needs are our priority.  As part of our continuing mission to provide you with exceptional heart care, our providers are all part of one team.  This team includes your primary Cardiologist (physician) and Advanced Practice Providers or APPs (Physician Assistants and Nurse Practitioners) who all work together to provide you with the care you need, when you need it.  Your next appointment:   As needed after your tests  Provider:    Emeline FORBES Calender, DO    We recommend signing up for the patient portal called MyChart.  Sign up information is provided on this After Visit Summary.  MyChart is used to connect with patients for Virtual Visits (Telemedicine).  Patients are able to view lab/test results, encounter notes, upcoming appointments, etc.  Non-urgent messages can be sent to your provider as well.   To learn more about what you can do with MyChart, go to [email or web address].   Other Instructions  Your cardiac CT will be scheduled at one of the below locations:   [facility] [address] [place], [place] [address] [phone] (Severe contrast allergies only)  [facility] [address]  [place], [place] [address]   If scheduled at [facility], please arrive at the [facility] and Children's Entrance (Entrance C2) of [facility] 30 minutes prior to test start time. You can use the FREE valet parking offered at entrance C (encouraged to control the heart rate for the test)  Proceed to the [facility] Radiology Department (first floor) to check-in and test prep.  All radiology patients and guests should use entrance C2 at [facility] accessed from [facility], even though the hospital's physical address listed is [address].  If scheduled at the Heart and Vascular Tower at [address], please enter the parking lot using the [address] entrance and use the FREE valet service at the patient drop-off area. Enter the building and check-in with registration on the main floor.  Please follow these instructions carefully (unless otherwise directed):  An IV will be required for  this test and Nitroglycerin will be given.  Hold all erectile dysfunction medications at least 3 days (72 hrs) prior to test. (Ie viagra, cialis, sildenafil, tadalafil, etc)   On the Night Before the Test: Be sure to Drink plenty of water. Do  not consume any caffeinated/decaffeinated beverages or chocolate 12 hours prior to your test. Do not take any antihistamines 12 hours prior to your test.  On the Day of the Test: Drink plenty of water until 1 hour prior to the test. Do not eat any food 1 hour prior to test. You may take your regular medications prior to the test.  Take metoprolol (Lopressor) 50 mg two hours prior to test. If you take Furosemide/Hydrochlorothiazide/Spironolactone/Chlorthalidone, please HOLD on the morning of the test. Patients who wear a continuous glucose monitor MUST remove the device prior to scanning.  After the Test: Drink plenty of water. After receiving IV contrast, you may experience a mild flushed feeling. This is normal. On occasion, you may experience a mild rash up to 24 hours after the test. This is not dangerous. If this occurs, you can take Benadryl 25 mg, Zyrtec, Claritin, or Allegra and increase your fluid intake. (Patients taking Tikosyn should avoid Benadryl, and may take Zyrtec, Claritin, or Allegra) If you experience trouble breathing, this can be serious. If it is severe call 911 IMMEDIATELY. If it is mild, please call our office.  We will call to schedule your test 2-4 weeks out understanding that some insurance companies will need an authorization prior to the service being performed.   For more information and frequently asked questions, please visit our website : [email or web address]  For non-scheduling related questions, please contact the cardiac imaging nurse navigator should you have any questions/concerns: Cardiac Imaging Nurse Navigators Direct Office Dial: [phone]   For scheduling needs, including cancellations and rescheduling, please call [place], [phone].

## 2024-07-24 NOTE — Progress Notes (Signed)
 Cardiology Office Note   Date:  07/24/2024  ID:  Joshua Mendez, DOB 1971/05/19, MRN [id number] PCP: Joshua Other, MD  [facility] Providers Cardiologist:  Joshua FORBES Sorrow, MD (Inactive)     History of Present Illness Joshua Mendez is a 53 y.o. male who was previously seen by Dr. Sorrow and Dr. Delford with a past medical history of obesity, depression, GERD, Schatzki's ring with esophageal dilatation, hypertension, diverticulitis, type 2 diabetes with diabetic retinopathy, erectile dysfunction, orthostatic hypotension/syncope with resolution of symptoms after stopping lisinopril and Farxiga eosinophilic esophagitis who presents today after referral from his PCP for lower extremity swelling and chest pain.  Patient states that about 6 weeks ago he went on a bacon binge and had 3 packs of bacon and 3 days.  He then went on an 8-hour road trip to [place]  without any stops followed by a repeat long road trip back to [place]  the following day.  During that time and for about the next week or so he had significant increase swelling in his legs and chest heaviness which was intermittent.  He improved this by keeping his legs elevated.  His symptoms overall seem to have resolved however he did have recurrent chest heaviness last week.  This tends to occur with rest and not on exertion but is substernal.  Denies tobacco use.  Drinks socially rarely.  Otherwise no significant cardiac history.    ROS:  Review of Systems  All Mendez systems reviewed and are negative.   Physical Exam  Physical Exam Vitals and nursing note reviewed.  Constitutional:      Appearance: Normal appearance.  HENT:     Head: Normocephalic and atraumatic.  Eyes:     Conjunctiva/sclera: Conjunctivae normal.  Neck:     Vascular: No hepatojugular reflux or JVD.  Cardiovascular:     Rate and Rhythm: Normal rate and regular rhythm.  Pulmonary:     Effort: Pulmonary effort is normal.     Breath  sounds: Normal breath sounds.  Musculoskeletal:        General: No swelling or tenderness.  Skin:    Coloration: Skin is not jaundiced or pale.  Neurological:     Mental Status: He is alert.     VS:  BP (!) 119/52   Pulse 85   Ht 6' 1 (1.854 m)   Wt 241 lb 12.8 oz (109.7 kg)   SpO2 97%   BMI 31.90 kg/m         Wt Readings from Last 3 Encounters:  07/24/24 241 lb 12.8 oz (109.7 kg)  05/15/20 254 lb 12.8 oz (115.6 kg)  12/21/16 255 lb (115.7 kg)     EKG Interpretation Date/Time:  Wednesday July 24 2024 15:20:51 EST Ventricular Rate:  85 PR Interval:  146 QRS Duration:  88 QT Interval:  396 QTC Calculation: 471 R Axis:   11  Text Interpretation: Normal sinus rhythm Normal ECG No previous ECGs available Confirmed by Kriste Hicks [id number]) on 07/24/2024 3:27:18 PM    Studies Reviewed   Nuclear stress 2018: Normal perfusion EF 45-54%.   TTE 12/21/16: Mildly dilated LV, normal LVEF 55-60%. Normal wall motion.   Risk Assessment/Calculations             ASCVD risk score: The ASCVD Risk score (Arnett DK, et al., 2019) failed to calculate for the following reasons:   Cannot find a previous HDL lab   Cannot find a previous total cholesterol lab   ASSESSMENT  Lower  extremity edema in the setting of very high salt intake over a short period of time.  Has since resolved.  Cannot rule out a mild heart failure exacerbation.  Does not have a history of heart failure but does have risk factors including insulin-dependent diabetes and obesity Chest pain in the setting of volume overload about 6 weeks ago.  No known history of underlying CAD and had a negative stress test in 2018.  Likely due to volume overload however cannot rule out the development of coronary artery disease and has risk factors including insulin-dependent diabetes with small vessel disease, hypertension and obesity. Insulin dependent diabetes with small vessel disease Obesity Dyslipidemia on  rosuvastatin  Plan  Will check an echocardiogram Will check a coronary CTA with metoprolol tartrate prior.  He does not have any contrast allergies Advised to stick to a low-salt diet and refrain from having such high volumes of bacon  Follow up: Depending on above workup          Signed, Emeline FORBES Calender, DO

## 2024-07-24 NOTE — Addendum Note (Signed)
 Addended by: KRASOWSKI, Doha Boling on: 07/24/2024 04:08 PM   Modules accepted: Orders

## 2024-07-29 DIAGNOSIS — R748 Abnormal levels of other serum enzymes: Secondary | ICD-10-CM | POA: Diagnosis not present

## 2024-08-02 ENCOUNTER — Ambulatory Visit (HOSPITAL_COMMUNITY): Admission: RE | Admit: 2024-08-02 | Discharge: 2024-08-02 | Attending: Internal Medicine

## 2024-08-02 DIAGNOSIS — R079 Chest pain, unspecified: Secondary | ICD-10-CM | POA: Diagnosis not present

## 2024-08-02 LAB — ECHOCARDIOGRAM COMPLETE
Area-P 1/2: 3.99 cm2
S' Lateral: 2.8 cm

## 2024-08-05 ENCOUNTER — Ambulatory Visit: Payer: Self-pay | Admitting: Internal Medicine

## 2024-08-16 ENCOUNTER — Encounter (HOSPITAL_COMMUNITY): Payer: Self-pay

## 2024-08-20 ENCOUNTER — Ambulatory Visit (HOSPITAL_COMMUNITY)

## 2024-08-30 ENCOUNTER — Ambulatory Visit (HOSPITAL_COMMUNITY)
